# Patient Record
Sex: Female | Born: 1995 | Hispanic: Yes | Marital: Single | Smoking: Current every day smoker
Health system: Southern US, Community
[De-identification: ages and names within clinical notes are randomized; demographics above are authoritative.]

## PROBLEM LIST (undated history)

## (undated) HISTORY — PX: NO PAST SURGERIES: SHX2092

---
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
## 2017-08-27 ENCOUNTER — Encounter: Payer: Self-pay | Admitting: Physician Assistant

## 2017-08-27 ENCOUNTER — Ambulatory Visit (INDEPENDENT_AMBULATORY_CARE_PROVIDER_SITE_OTHER): Payer: Medicaid Other | Admitting: Physician Assistant

## 2017-08-27 VITALS — BP 112/64 | HR 84 | Temp 98.4°F | Resp 16 | Ht 64.0 in | Wt 122.0 lb

## 2017-08-27 DIAGNOSIS — F329 Major depressive disorder, single episode, unspecified: Secondary | ICD-10-CM

## 2017-08-27 DIAGNOSIS — F32A Depression, unspecified: Secondary | ICD-10-CM

## 2017-08-27 DIAGNOSIS — F419 Anxiety disorder, unspecified: Secondary | ICD-10-CM

## 2017-08-27 DIAGNOSIS — Z113 Encounter for screening for infections with a predominantly sexual mode of transmission: Secondary | ICD-10-CM

## 2017-08-27 DIAGNOSIS — M549 Dorsalgia, unspecified: Secondary | ICD-10-CM | POA: Diagnosis not present

## 2017-08-27 DIAGNOSIS — Z114 Encounter for screening for human immunodeficiency virus [HIV]: Secondary | ICD-10-CM

## 2017-08-27 MED ORDER — DULOXETINE HCL 30 MG PO CPEP: 30.0000 mg | ORAL_CAPSULE | Freq: Every day | ORAL | 0 refills | Status: AC

## 2017-08-27 NOTE — Progress Notes (Signed)
Patient: Mary Waller Female    DOB: [birth year]   21 y.o.   MRN: [id number] Visit Date: 08/27/2017  Today's Provider: Trey SailorsAdriana M Pollak, PA-C   Chief Complaint  Patient presents with  . Establish Care  . Back Pain   Subjective:    Mary Waller presents today to establish care. She lives in [place], [place] and is originally from [place], [place]. She is currently living with her boyfriend of a year, feels safe and supported. She is not working, plans on starting business. Has pet cat, Snowflake, and turtle, tater tot.  She is currently smoking 3 cigarettes per day, down from half a pack per day. Occasionally uses Marijuana  Had Nexplanon placed 03/2016. She has 6 siblings.   Parents living, has a closer relationship with her god parents.  She is sexually active with single female partner, her boyfriend. She would like to be screened for STIs today.   She reports several year history of back pain. Pertinent info below. She reports these get worse with anxiety. Also report at some point she was told she may have scoliosis but was never evaluated via Xrays.   She reports a history of anxiety and panic attacks. Reports this increases when she is around her family and decreases when she is not. No SI/HI.   Back Pain  This is a chronic problem. The current episode started more than 1 year ago. The problem is unchanged. The pain is present in the lumbar spine (Pt reports her back pain is "all over" but lower back is the worse.). The quality of the pain is described as aching and stabbing. The pain radiates to the right thigh and left thigh. The symptoms are aggravated by position and sitting. Pertinent negatives include no abdominal pain, bladder incontinence, bowel incontinence, chest pain, dysuria, fever, headaches, leg pain, numbness, paresis, paresthesias, pelvic pain, perianal numbness, tingling, weakness or weight loss. She has tried NSAIDs for the symptoms. The treatment provided mild  relief.       No Known Allergies   Current Outpatient Medications:  .  etonogestrel (NEXPLANON) 68 MG IMPL implant, 1 each by Subdermal route once., Disp: , Rfl:   Review of Systems  Constitutional: Negative.  Negative for fever and weight loss.  HENT: Negative.   Eyes: Negative.   Respiratory: Negative.   Cardiovascular: Negative for chest pain.  Gastrointestinal: Negative for abdominal pain and bowel incontinence.  Endocrine: Negative.   Genitourinary: Negative for bladder incontinence, dysuria and pelvic pain.  Musculoskeletal: Positive for back pain.  Neurological: Negative for dizziness, tingling, tremors, seizures, syncope, facial asymmetry, speech difficulty, weakness, light-headedness, numbness, headaches and paresthesias.    Social History   Tobacco Use  . Smoking status: Current Every Day Smoker    Packs/day: 0.25    Years: 1.00    Pack years: 0.25    Types: Cigarettes  . Smokeless tobacco: Never Used  Substance Use Topics  . Alcohol use: No    Comment: Very Rarely   Objective:   BP 112/64 (BP Location: Right Arm, Patient Position: Sitting, Cuff Size: Normal)   Pulse 84   Temp 98.4 F (36.9 C) (Oral)   Resp 16   Ht 5\' 4"  (1.626 m)   Wt 122 lb (55.3 kg)   LMP 08/18/2017   BMI 20.94 kg/m  Vitals:   08/27/17 1027  BP: 112/64  Pulse: 84  Resp: 16  Temp: 98.4 F (36.9 C)  TempSrc: Oral  Weight: 122 lb (  55.3 kg)  Height: 5\' 4"  (1.626 m)     Physical Exam  Constitutional: She is oriented to person, place, and time. She appears well-developed and well-nourished.  HENT:  Right Ear: External ear normal.  Left Ear: External ear normal.  Eyes: Conjunctivae are normal.  Neck: Neck supple.  Cardiovascular: Normal rate and regular rhythm.  Pulmonary/Chest: Effort normal and breath sounds normal.  Musculoskeletal: Normal range of motion. She exhibits no edema, tenderness or deformity.  Right shoulder blade slightly higher than left on Adams forward  bend test.   Lymphadenopathy:    She has no cervical adenopathy.  Neurological: She is alert and oriented to person, place, and time.  Skin: Skin is warm and dry.  Psychiatric: She has a normal mood and affect. Her behavior is normal.        Assessment & Plan:     1. Anxiety and depression  - DULoxetine (CYMBALTA) 30 MG capsule; Take 1 capsule (30 mg total) by mouth daily.  Dispense: 90 capsule; Refill: 0  2. Back pain, unspecified back location, unspecified back pain laterality, unspecified chronicity  Possible scoliosis, offered XRAY but counseled that unless curvature is very severe which it does not appear to be in clinic, that treatment will largely be symptomatic with physical therapy and pain relief. Will start Cymbalta for anxiety which may also help back pain.  - DULoxetine (CYMBALTA) 30 MG capsule; Take 1 capsule (30 mg total) by mouth daily.  Dispense: 90 capsule; Refill: 0  3. Screening examination for STD (sexually transmitted disease)  - DULoxetine (CYMBALTA) 30 MG capsule; Take 1 capsule (30 mg total) by mouth daily.  Dispense: 90 capsule; Refill: 0 - Chlamydia/Gonococcus/Trichomonas, NAA  4. Encounter for screening for HIV  - HIV antibody (with reflex)  Return in about 1 month (around 09/24/2017) for cymbalta, anxiety, back pain.  The entirety of the information documented in the History of Present Illness, Review of Systems and Physical Exam were personally obtained by me. Portions of this information were initially documented by Kavin Leech, CMA and reviewed by me for thoroughness and accuracy.         Trey Sailors, PA-C  [facility]

## 2017-08-28 ENCOUNTER — Other Ambulatory Visit: Payer: Self-pay | Admitting: Physician Assistant

## 2017-08-28 DIAGNOSIS — F32A Depression, unspecified: Secondary | ICD-10-CM | POA: Insufficient documentation

## 2017-08-28 DIAGNOSIS — M549 Dorsalgia, unspecified: Secondary | ICD-10-CM | POA: Insufficient documentation

## 2017-08-28 DIAGNOSIS — F419 Anxiety disorder, unspecified: Secondary | ICD-10-CM | POA: Insufficient documentation

## 2017-08-28 DIAGNOSIS — F329 Major depressive disorder, single episode, unspecified: Secondary | ICD-10-CM | POA: Insufficient documentation

## 2017-08-28 LAB — HIV ANTIBODY (ROUTINE TESTING W REFLEX): HIV Screen 4th Generation wRfx: NONREACTIVE

## 2017-08-29 ENCOUNTER — Telehealth: Payer: Self-pay

## 2017-08-29 ENCOUNTER — Other Ambulatory Visit: Payer: Self-pay | Admitting: Physician Assistant

## 2017-08-29 DIAGNOSIS — A599 Trichomoniasis, unspecified: Secondary | ICD-10-CM

## 2017-08-29 LAB — CHLAMYDIA/GONOCOCCUS/TRICHOMONAS, NAA
Chlamydia by NAA: NEGATIVE
Gonococcus by NAA: NEGATIVE
Trich vag by NAA: POSITIVE — AB

## 2017-08-29 MED ORDER — METRONIDAZOLE 500 MG PO TABS: 2000.0000 mg | ORAL_TABLET | Freq: Once | ORAL | 0 refills | Status: AC

## 2017-08-29 NOTE — Telephone Encounter (Signed)
[id number] 

## 2017-08-29 NOTE — Telephone Encounter (Signed)
-----   Message from Trey SailorsAdriana M Pollak, [place]-C sent at 08/29/2017  8:38 AM EST ----- Gonorrhea and chlamydia are negative. Trichomonas is positive. The treatment for this is 2g flagyl in a single dose. Do not use alcohol with this medication. Do not have sex for a week after treatment. Her partner should be tested. I will send this medication in to her pharmacy. She should return to clinic in three months for retest as rate of reinfection is high.

## 2017-08-29 NOTE — Progress Notes (Signed)
[facility]yl [facility] to [facility] [place].

## 2017-08-29 NOTE — Telephone Encounter (Signed)
Patient was advised. KW 

## 2017-09-02 ENCOUNTER — Telehealth: Payer: Self-pay | Admitting: Physician Assistant

## 2017-09-02 DIAGNOSIS — R112 Nausea with vomiting, unspecified: Secondary | ICD-10-CM

## 2017-09-02 NOTE — Telephone Encounter (Signed)
Pt stated that she has been taking DULoxetine (CYMBALTA) 30 MG capsule about 20 minutes before she eats and the medication makes her nauseous and she vomits. Pt is requesting a call back to see if there is a lower dose she could try or what she should do. [facility]. Please advise. Thanks TNP

## 2017-09-03 NOTE — Telephone Encounter (Signed)
Pt advised.  She agreed to try a short course of nausea medication.  Please send to [facility] [place].   Thanks,   -Vernona RiegerLaura

## 2017-09-03 NOTE — Telephone Encounter (Signed)
This is a side effect of the medication. It does get better the longer you take it. I would advise taking it with a little bit of toast or crackers, taking it at night. If she likes, I can send her in a short course of nausea medication to get her through the initial couple weeks where this side effect is the worst.

## 2017-09-04 MED ORDER — ONDANSETRON HCL 4 MG PO TABS: 4.0000 mg | ORAL_TABLET | Freq: Three times a day (TID) | ORAL | 0 refills | Status: DC | PRN

## 2017-09-04 NOTE — Addendum Note (Signed)
Addended by: Trey SailorsPOLLAK, ADRIANA M on: 09/04/2017 08:50 AM   Modules accepted: Orders

## 2017-09-04 NOTE — Telephone Encounter (Signed)
[facility]fran sent to [facility].

## 2017-09-19 NOTE — Telephone Encounter (Signed)
Encounter opened in error

## 2017-09-25 ENCOUNTER — Ambulatory Visit: Payer: Medicaid Other | Admitting: Physician Assistant

## 2018-12-28 ENCOUNTER — Emergency Department
Admission: EM | Admit: 2018-12-28 | Discharge: 2018-12-28 | Disposition: A | Discharge status: Home / Self Care | Payer: Self-pay | Attending: Emergency Medicine | Admitting: Emergency Medicine

## 2018-12-28 ENCOUNTER — Other Ambulatory Visit: Payer: Self-pay

## 2018-12-28 ENCOUNTER — Encounter: Payer: Self-pay | Admitting: *Deleted

## 2018-12-28 ENCOUNTER — Emergency Department: Payer: Self-pay

## 2018-12-28 DIAGNOSIS — S99922A Unspecified injury of left foot, initial encounter: Secondary | ICD-10-CM | POA: Insufficient documentation

## 2018-12-28 DIAGNOSIS — Y929 Unspecified place or not applicable: Secondary | ICD-10-CM | POA: Insufficient documentation

## 2018-12-28 DIAGNOSIS — Y9389 Activity, other specified: Secondary | ICD-10-CM | POA: Insufficient documentation

## 2018-12-28 DIAGNOSIS — Z79899 Other long term (current) drug therapy: Secondary | ICD-10-CM | POA: Insufficient documentation

## 2018-12-28 DIAGNOSIS — Y998 Other external cause status: Secondary | ICD-10-CM | POA: Insufficient documentation

## 2018-12-28 DIAGNOSIS — W228XXA Striking against or struck by other objects, initial encounter: Secondary | ICD-10-CM | POA: Insufficient documentation

## 2018-12-28 DIAGNOSIS — F17210 Nicotine dependence, cigarettes, uncomplicated: Secondary | ICD-10-CM | POA: Insufficient documentation

## 2018-12-28 MED ORDER — IBUPROFEN 600 MG PO TABS: 600.0000 mg | ORAL_TABLET | Freq: Four times a day (QID) | ORAL | 0 refills | Status: AC | PRN

## 2018-12-28 MED ORDER — MELOXICAM 7.5 MG PO TABS
15.0000 mg | ORAL_TABLET | Freq: Once | ORAL | Status: AC
  Administered 2018-12-28: 15 mg via ORAL
  Filled 2018-12-28: qty 2

## 2018-12-28 NOTE — ED Provider Notes (Signed)
[facility] Emergency Department Provider Note  ____________________________________________  Time seen: Approximately 8:36 PM  I have reviewed the triage vital signs and the nursing notes.   HISTORY  Chief Complaint Ankle Pain    HPI Mary Waller is a 23 y.o. female that presents to the emergency department for evaluation of foot pain after injury today.  Patient states that she was looking at [employer] with her fianc when her flip-flop got caught in a step and her toes went upwards.  She has been icing foot with out relief.  No other injury.  No past medical history on file.  Patient Active Problem List   Diagnosis Date Noted  . Anxiety and depression 08/28/2017  . Back pain 08/28/2017    Past Surgical History:  Procedure Laterality Date  . NO PAST SURGERIES      Prior to Admission medications   Medication Sig Start Date End Date Taking? Authorizing Provider  DULoxetine (CYMBALTA) 30 MG capsule Take 1 capsule (30 mg total) by mouth daily. 08/27/17 11/25/17  Trey SailorsPollak, Adriana M, PA-C  etonogestrel (NEXPLANON) 68 MG IMPL implant 1 each by Subdermal route once.    [provider]  ibuprofen (ADVIL) 600 MG tablet Take 1 tablet (600 mg total) by mouth every 6 (six) hours as needed. 12/28/18   Enid DerryWagner, Corrina Steffensen, PA-C  ondansetron (ZOFRAN) 4 MG tablet Take 1 tablet (4 mg total) by mouth every 8 (eight) hours as needed for nausea or vomiting. 09/04/17   Trey SailorsPollak, Adriana M, PA-C    Allergies Patient has no known allergies.  Family History  Problem Relation Age of Onset  . Depression Mother   . Anxiety disorder Mother   . Sleep apnea Mother   . Arthritis Mother   . Healthy Father   . Alcohol abuse Father   . Breast cancer Maternal Grandmother        Breast Cancer in her 2730's  . Leukemia Maternal Grandfather   . Diabetes Paternal Grandmother   . Diabetes Paternal Grandfather   . Cancer Paternal Grandfather     Social History Social History    Tobacco Use  . Smoking status: Current Every Day Smoker    Packs/day: 0.25    Years: 1.00    Pack years: 0.25    Types: Cigarettes  . Smokeless tobacco: Never Used  Substance Use Topics  . Alcohol use: No    Comment: Very Rarely  . Drug use: Yes    Types: Marijuana     Review of Systems  Gastrointestinal: No abdominal pain. Musculoskeletal: Positive for foot pain.  Skin: Negative for rash, abrasions, lacerations. Positive for ecchymosis. Neurological: Negative for numbness or tingling   ____________________________________________   PHYSICAL EXAM:  VITAL SIGNS: ED Triage Vitals  Enc Vitals Group     BP 12/28/18 1957 104/71     Pulse Rate 12/28/18 1957 (!) 124     Resp 12/28/18 1957 20     Temp 12/28/18 1957 98.7 F (37.1 C)     Temp Source 12/28/18 1957 Oral     SpO2 12/28/18 1957 97 %     Weight --      Height --      Head Circumference --      Peak Flow --      Pain Score 12/28/18 1956 7     Pain Loc --      Pain Edu? --      Excl. in GC? --      Constitutional: Alert  and oriented. Well appearing and in no acute distress. Eyes: Conjunctivae are normal. PERRL. EOMI. Head: Atraumatic. ENT:      Ears:      Nose: No congestion/rhinnorhea.      Mouth/Throat: Mucous membranes are moist.  Neck: No stridor.   Cardiovascular: Normal rate, regular rhythm.  Good peripheral circulation. Respiratory: Normal respiratory effort without tachypnea or retractions. Lungs CTAB. Good air entry to the bases with no decreased or absent breath sounds. Musculoskeletal: Full range of motion to all extremities. No gross deformities appreciated.  Tenderness to palpation to fourth and fifth left toes with some mild overlying ecchymosis.  Palpable dorsalis pulses bilaterally. Neurologic:  Normal speech and language. No gross focal neurologic deficits are appreciated.  Skin:  Skin is warm, dry and intact. No rash noted. Psychiatric: Mood and affect are normal. Speech and behavior  are normal. Patient exhibits appropriate insight and judgement.   ____________________________________________   LABS (all labs ordered are listed, but only abnormal results are displayed)  Labs Reviewed - No data to display ____________________________________________  EKG   ____________________________________________  RADIOLOGY Robinette Haines, personally viewed and evaluated these images (plain radiographs) as part of my medical decision making, as well as reviewing the written report by the radiologist.  Dg Foot Complete Left  Result Date: 12/28/2018 CLINICAL DATA:  Left foot pain after trip and fall today. EXAM: LEFT FOOT - COMPLETE 3+ VIEW COMPARISON:  None. FINDINGS: There is no evidence of fracture or dislocation. There is no evidence of arthropathy or other focal bone abnormality. Soft tissues are unremarkable. IMPRESSION: Negative radiographs of the left foot. Electronically Signed   By: Keith Rake M.D.   On: 12/28/2018 21:19    ____________________________________________    PROCEDURES  Procedure(s) performed:    Procedures    Medications  meloxicam (MOBIC) tablet 15 mg (15 mg Oral Given 12/28/18 2156)     ____________________________________________   INITIAL IMPRESSION / ASSESSMENT AND PLAN / ED COURSE  Pertinent labs & imaging results that were available during my care of the patient were reviewed by me and considered in my medical decision making (see chart for details).  Review of the [place] CSRS was performed in accordance of the [place] prior to dispensing any controlled drugs.   Patient presented to the emergency department for evaluation of foot injury.  Vital signs and exam are reassuring.  X-ray negative for acute bony abnormalities.  Ace wrap was placed.  Postop shoe was given.  Patient declines crutches.  Mobic was given for pain inflammation.  Patient will be discharged home with prescriptions for Motrin. Patient is to follow up with  primary care as directed. Patient is given ED precautions to return to the ED for any worsening or new symptoms.     ____________________________________________  FINAL CLINICAL IMPRESSION(S) / ED DIAGNOSES  Final diagnoses:  Injury of left foot, initial encounter      NEW MEDICATIONS STARTED DURING THIS VISIT:  ED Discharge Orders         Ordered    ibuprofen (ADVIL) 600 MG tablet  Every 6 hours PRN     12/28/18 2157              This chart was dictated using voice recognition software/Dragon. Despite best efforts to proofread, errors can occur which can change the meaning. Any change was purely unintentional.    Laban Emperor, PA-C 12/28/18 2248    Carrie Mew, MD 12/28/18 [phone]

## 2018-12-28 NOTE — ED Triage Notes (Signed)
Pt has left ankle and foot pain.  Pt tripped and fell today.  Pt alert

## 2019-03-02 ENCOUNTER — Emergency Department (HOSPITAL_COMMUNITY)
Admission: EM | Admit: 2019-03-02 | Discharge: 2019-03-02 | Disposition: A | Discharge status: Home / Self Care | Payer: Self-pay | Attending: Emergency Medicine | Admitting: Emergency Medicine

## 2019-03-02 ENCOUNTER — Encounter (HOSPITAL_COMMUNITY): Payer: Self-pay | Admitting: Emergency Medicine

## 2019-03-02 ENCOUNTER — Emergency Department (HOSPITAL_COMMUNITY): Payer: Self-pay

## 2019-03-02 DIAGNOSIS — S8011XA Contusion of right lower leg, initial encounter: Secondary | ICD-10-CM

## 2019-03-02 DIAGNOSIS — Y929 Unspecified place or not applicable: Secondary | ICD-10-CM | POA: Insufficient documentation

## 2019-03-02 DIAGNOSIS — R739 Hyperglycemia, unspecified: Secondary | ICD-10-CM

## 2019-03-02 DIAGNOSIS — T50901A Poisoning by unspecified drugs, medicaments and biological substances, accidental (unintentional), initial encounter: Secondary | ICD-10-CM

## 2019-03-02 DIAGNOSIS — D7589 Other specified diseases of blood and blood-forming organs: Secondary | ICD-10-CM

## 2019-03-02 DIAGNOSIS — T404X1A Poisoning by other synthetic narcotics, accidental (unintentional), initial encounter: Secondary | ICD-10-CM | POA: Insufficient documentation

## 2019-03-02 DIAGNOSIS — F17210 Nicotine dependence, cigarettes, uncomplicated: Secondary | ICD-10-CM | POA: Insufficient documentation

## 2019-03-02 DIAGNOSIS — W010XXA Fall on same level from slipping, tripping and stumbling without subsequent striking against object, initial encounter: Secondary | ICD-10-CM | POA: Insufficient documentation

## 2019-03-02 DIAGNOSIS — R7309 Other abnormal glucose: Secondary | ICD-10-CM | POA: Insufficient documentation

## 2019-03-02 DIAGNOSIS — Y999 Unspecified external cause status: Secondary | ICD-10-CM | POA: Insufficient documentation

## 2019-03-02 DIAGNOSIS — W260XXA Contact with knife, initial encounter: Secondary | ICD-10-CM | POA: Insufficient documentation

## 2019-03-02 DIAGNOSIS — S61211A Laceration without foreign body of left index finger without damage to nail, initial encounter: Secondary | ICD-10-CM

## 2019-03-02 DIAGNOSIS — Y939 Activity, unspecified: Secondary | ICD-10-CM | POA: Insufficient documentation

## 2019-03-02 DIAGNOSIS — Z79899 Other long term (current) drug therapy: Secondary | ICD-10-CM | POA: Insufficient documentation

## 2019-03-02 LAB — COMPREHENSIVE METABOLIC PANEL
ALT: 18 U/L (ref 0–44)
AST: 20 U/L (ref 15–41)
Albumin: 4.2 g/dL (ref 3.5–5.0)
Alkaline Phosphatase: 52 U/L (ref 38–126)
Anion gap: 8 (ref 5–15)
BUN: 16 mg/dL (ref 6–20)
CO2: 24 mmol/L (ref 22–32)
Calcium: 9.1 mg/dL (ref 8.9–10.3)
Chloride: 105 mmol/L (ref 98–111)
Creatinine, Ser: 0.76 mg/dL (ref 0.44–1.00)
GFR calc Af Amer: >60 mL/min (ref >60)
GFR calc non Af Amer: >60 mL/min (ref >60)
Glucose, Bld: 169 mg/dL — ABNORMAL HIGH (ref 70–99)
Potassium: 3.9 mmol/L (ref 3.5–5.1)
Sodium: 137 mmol/L (ref 135–145)
Total Bilirubin: 0.4 mg/dL (ref 0.3–1.2)
Total Protein: 7.2 g/dL (ref 6.5–8.1)

## 2019-03-02 LAB — CBC WITH DIFFERENTIAL/PLATELET
Abs Immature Granulocytes: 0.15 10*3/uL — ABNORMAL HIGH (ref 0.00–0.07)
Basophils Absolute: 0.1 10*3/uL (ref 0.0–0.1)
Basophils Relative: 0 %
Eosinophils Absolute: 0 10*3/uL (ref 0.0–0.5)
Eosinophils Relative: 0 %
HCT: 38.7 % (ref 36.0–46.0)
Hemoglobin: 12.5 g/dL (ref 12.0–15.0)
Immature Granulocytes: 1 %
Lymphocytes Relative: 13 %
Lymphs Abs: 2.2 10*3/uL (ref 0.7–4.0)
MCH: 32.4 pg (ref 26.0–34.0)
MCHC: 32.3 g/dL (ref 30.0–36.0)
MCV: 100.3 fL — ABNORMAL HIGH (ref 80.0–100.0)
Monocytes Absolute: 1 10*3/uL (ref 0.1–1.0)
Monocytes Relative: 6 %
Neutro Abs: 13.5 10*3/uL — ABNORMAL HIGH (ref 1.7–7.7)
Neutrophils Relative %: 80 %
Platelets: 231 10*3/uL (ref 150–400)
RBC: 3.86 MIL/uL — ABNORMAL LOW (ref 3.87–5.11)
RDW: 12.8 % (ref 11.5–15.5)
WBC: 16.9 10*3/uL — ABNORMAL HIGH (ref 4.0–10.5)
nRBC: 0 % (ref 0.0–0.2)

## 2019-03-02 LAB — ETHANOL: Alcohol, Ethyl (B): <10 mg/dL (ref <10)

## 2019-03-02 LAB — SALICYLATE LEVEL: Salicylate Lvl: <7 mg/dL (ref 2.8–30.0)

## 2019-03-02 LAB — I-STAT BETA HCG BLOOD, ED (MC, WL, AP ONLY): I-stat hCG, quantitative: <5 m[IU]/mL (ref <5)

## 2019-03-02 LAB — ACETAMINOPHEN LEVEL: Acetaminophen (Tylenol), Serum: <10 ug/mL — ABNORMAL LOW (ref 10–30)

## 2019-03-02 MED ORDER — LIDOCAINE HCL (PF) 1 % IJ SOLN
5.0000 mL | Freq: Once | INTRAMUSCULAR | Status: AC
  Administered 2019-03-02: 5 mL
  Filled 2019-03-02: qty 30

## 2019-03-02 NOTE — ED Notes (Signed)
XR at bedside

## 2019-03-02 NOTE — ED Triage Notes (Signed)
Patient here from home brought in by family with complaints of ingestion of approx 10-15 Tramadol 2 hours ago. Lethargic. Denies SI. Laceration noted to left hand. States that she was running with knife and fell cutting finger.

## 2019-03-02 NOTE — ED Notes (Signed)
Poison Control recommends:  -[id number]  -supportive care -do not narcan unless unresponsive due to seizures -6-8 hour observation

## 2019-03-02 NOTE — ED Provider Notes (Signed)
[facility]-EMERGENCY DEPT Provider Note   CSN: [id number] Arrival date & time: 03/02/19  7564    History   Chief Complaint Chief Complaint  Patient presents with  . Ingestion    HPI Mary Waller is a 23 y.o. female.   The history is provided by the patient.  Ingestion  She has history of anxiety and depression and is brought to the ED with possible overdose.  She had fallen earlier in the evening and injured her right lower leg.  She is complaining of pain in the lateral aspect of her right calf.  She had also accidentally cut her left second finger with a knife.  She took ibuprofen and then naproxen but was continued to have pain in her finger.  She took some tramadol and states that she took between 10 and 15 between 11 PM and 2AM.  Her boyfriend was worried that she may have taken too much and brought her in.  She denies suicidal intent.  She denies any other drug use.  She denies ethanol ingestion.  History reviewed. No pertinent past medical history.  Patient Active Problem List   Diagnosis Date Noted  . Anxiety and depression 08/28/2017  . Back pain 08/28/2017    Past Surgical History:  Procedure Laterality Date  . NO PAST SURGERIES       OB History   No obstetric history on file.      Home Medications    Prior to Admission medications   Medication Sig Start Date End Date Taking? Authorizing Provider  DULoxetine (CYMBALTA) 30 MG capsule Take 1 capsule (30 mg total) by mouth daily. 08/27/17 11/25/17  Trey Sailors, PA-C  etonogestrel (NEXPLANON) 68 MG IMPL implant 1 each by Subdermal route once.    [provider]  ibuprofen (ADVIL) 600 MG tablet Take 1 tablet (600 mg total) by mouth every 6 (six) hours as needed. 12/28/18   Enid Derry, PA-C  ondansetron (ZOFRAN) 4 MG tablet Take 1 tablet (4 mg total) by mouth every 8 (eight) hours as needed for nausea or vomiting. 09/04/17   Trey Sailors, PA-C    Family History  Family History  Problem Relation Age of Onset  . Depression Mother   . Anxiety disorder Mother   . Sleep apnea Mother   . Arthritis Mother   . Healthy Father   . Alcohol abuse Father   . Breast cancer Maternal Grandmother        Breast Cancer in her 71's  . Leukemia Maternal Grandfather   . Diabetes Paternal Grandmother   . Diabetes Paternal Grandfather   . Cancer Paternal Grandfather     Social History Social History   Tobacco Use  . Smoking status: Current Every Day Smoker    Packs/day: 0.25    Years: 1.00    Pack years: 0.25    Types: Cigarettes  . Smokeless tobacco: Never Used  Substance Use Topics  . Alcohol use: No    Comment: Very Rarely  . Drug use: Yes    Types: Marijuana     Allergies   Patient has no known allergies.   Review of Systems Review of Systems  All other systems reviewed and are negative.    Physical Exam Updated Vital Signs BP 108/71 (BP Location: Right Arm)   Pulse (!) 46   Temp 97.6 F (36.4 C) (Oral)   Resp 12   SpO2 98%   Physical Exam Vitals signs and nursing note reviewed.  23 year old female, drowsy but arousable, and in no acute distress. Vital signs are significant for slow heart rate. Oxygen saturation is 98%, which is normal. Head is normocephalic and atraumatic.  Pupils are 3 mm, EOMI. Oropharynx is clear. Neck is nontender and supple without adenopathy or JVD. Back is nontender and there is no CVA tenderness. Lungs are clear without rales, wheezes, or rhonchi. Chest is nontender. Heart has regular rate and rhythm without murmur. Abdomen is soft, flat, nontender without masses or hepatosplenomegaly and peristalsis is normoactive. Extremities have no cyanosis or edema, full range of motion is present.  Laceration is present at the PIP joint of the left second finger.  Mild tenderness is noted in the right mid calf laterally. Skin is warm and dry without rash. Neurologic: Sleepy but arousable, speech slightly  slurred, oriented x3, cranial nerves are intact, there are no motor or sensory deficits.  ED Treatments / Results  Labs (all labs ordered are listed, but only abnormal results are displayed) Labs Reviewed  CBC WITH DIFFERENTIAL/PLATELET - Abnormal; Notable for the following components:      Result Value   WBC 16.9 (*)    RBC 3.86 (*)    MCV 100.3 (*)    Neutro Abs 13.5 (*)    Abs Immature Granulocytes 0.15 (*)    All other components within normal limits  COMPREHENSIVE METABOLIC PANEL  ETHANOL  ACETAMINOPHEN LEVEL  SALICYLATE LEVEL  RAPID URINE DRUG SCREEN, HOSP PERFORMED  I-STAT BETA HCG BLOOD, ED ([facility], [facility], [facility] ONLY)    EKG EKG Interpretation  Date/Time:  Tuesday March 02 2019 05:34:37 EDT Ventricular Rate:  52 PR Interval:    QRS Duration: 98 QT Interval:  467 QTC Calculation: 435 R Axis:   88 Text Interpretation:  Sinus rhythm Probable left atrial enlargement RSR' in V1 or V2, probably normal variant Nonspecific T abnrm, anterolateral leads ST elev, probable normal early repol pattern No old tracing to compare Confirmed by Delora Fuel ([id number]) on 03/02/2019 5:45:07 AM   Radiology No results found.  Procedures .Marland KitchenLaceration Repair  Date/Time: 03/02/2019 6:55 AM Performed by: Delora Fuel, MD Authorized by: Delora Fuel, MD   Consent:    Consent obtained:  Verbal   Consent given by:  Patient   Risks discussed:  Infection and pain   Alternatives discussed:  No treatment Anesthesia (see MAR for exact dosages):    Anesthesia method:  Local infiltration   Local anesthetic:  Lidocaine 1% w/o epi Laceration details:    Location:  Finger   Finger location:  L index finger   Length (cm):  3   Depth (mm):  3 Repair type:    Repair type:  Simple Pre-procedure details:    Preparation:  Patient was prepped and draped in usual sterile fashion Exploration:    Hemostasis achieved with:  Direct pressure   Wound exploration: entire depth of wound probed and visualized      Wound extent: no foreign bodies/material noted, no nerve damage noted, no tendon damage noted and no vascular damage noted     Contaminated: no   Treatment:    Area cleansed with:  Saline   Amount of cleaning:  Standard Skin repair:    Repair method:  Sutures   Suture size:  5-0   Suture material:  Prolene   Suture technique:  Simple interrupted   Number of sutures:  6 Approximation:    Approximation:  Close Post-procedure details:    Dressing:  Antibiotic ointment  Patient tolerance of procedure:  Tolerated well, no immediate complications    CRITICAL CARE Performed by: Dione Boozeavid Nakeshia Waldeck Total critical care time: 35 minutes Critical care time was exclusive of separately billable procedures and treating other patients. Critical care was necessary to treat or prevent imminent or life-threatening deterioration. Critical care was time spent personally by me on the following activities: development of treatment plan with patient and/or surrogate as well as nursing, discussions with consultants, evaluation of patient's response to treatment, examination of patient, obtaining history from patient or surrogate, ordering and performing treatments and interventions, ordering and review of laboratory studies, ordering and review of radiographic studies, pulse oximetry and re-evaluation of patient's condition.  Medications Ordered in ED Medications  lidocaine (PF) (XYLOCAINE) 1 % injection 5 mL (5 mLs Infiltration Given by Other 03/02/19 [id number])     Initial Impression / Assessment and Plan / ED Course  I have reviewed the triage vital signs and the nursing notes.  Pertinent labs & imaging results that were available during my care of the patient were reviewed by me and considered in my medical decision making (see chart for details).  Apparent narcotic overdose, but maintaining adequate respirations and oxygen saturation.  Laceration of left second finger will require sutures.  Injury to right lower  leg, will check chest x-ray.  Will obtain consultation with poison control, but I would anticipate with tramadol overdose she needs to be observed for seizures and respiratory depression.  ECG is obtained showing normal QT interval.  Old records are reviewed, and she has no relevant past visits.  Poison control recommends observation for 6 hours.  Laceration is closed with sutures.  Labs are significant for mild macrocytosis without anemia.  Ethanol, acetaminophen, salicylate not detectable.  Glucose is mildly elevated 169 and will need to be followed as an, suspect stress-induced hyperglycemia.  Case is signed out to Dr. Madilyn Hookees.  Final Clinical Impressions(s) / ED Diagnoses   Final diagnoses:  Accidental drug overdose, initial encounter  Laceration of left index finger, initial encounter  Contusion of right calf, initial encounter  Macrocytosis without anemia  Elevated random blood glucose level    ED Discharge Orders    None       Dione BoozeGlick, Jodine Muchmore, MD 03/02/19 0700

## 2019-03-02 NOTE — ED Provider Notes (Signed)
Patient care assumed at 0700.  Pt here for evaluation following accidental tramadol overdose.  Plan to obs until 1030.  On reassessment patient is requesting discharge home. She is awake and alert and in no acute distress. She denies any active suicidal ideation. Discussed with patient recommendation from poison control for observation until 1030. Discussed with patient's husband recommendations as well. Discussed the risk of being discharged early of respiratory depression, seizure. They understand this risk and wish to be discharged. Patient was discharged against medical advice. Discussed with her home care for accidental overdose and also laceration. Return precautions discussed.   Quintella Reichert, MD 03/02/19 [phone]

## 2019-03-02 NOTE — ED Notes (Signed)
Suture cart at bedside 

## 2019-06-13 ENCOUNTER — Emergency Department (HOSPITAL_COMMUNITY)
Admission: EM | Admit: 2019-06-13 | Discharge: 2019-06-13 | Disposition: A | Discharge status: Home / Self Care | Payer: Self-pay | Attending: Emergency Medicine | Admitting: Emergency Medicine

## 2019-06-13 ENCOUNTER — Encounter (HOSPITAL_COMMUNITY): Payer: Self-pay

## 2019-06-13 ENCOUNTER — Emergency Department (HOSPITAL_COMMUNITY): Payer: Self-pay

## 2019-06-13 ENCOUNTER — Other Ambulatory Visit: Payer: Self-pay

## 2019-06-13 DIAGNOSIS — R102 Pelvic and perineal pain: Secondary | ICD-10-CM | POA: Insufficient documentation

## 2019-06-13 DIAGNOSIS — F17210 Nicotine dependence, cigarettes, uncomplicated: Secondary | ICD-10-CM | POA: Insufficient documentation

## 2019-06-13 DIAGNOSIS — R1031 Right lower quadrant pain: Secondary | ICD-10-CM

## 2019-06-13 DIAGNOSIS — N130 Hydronephrosis with ureteropelvic junction obstruction: Secondary | ICD-10-CM | POA: Insufficient documentation

## 2019-06-13 DIAGNOSIS — N201 Calculus of ureter: Secondary | ICD-10-CM

## 2019-06-13 LAB — I-STAT BETA HCG BLOOD, ED (MC, WL, AP ONLY): I-stat hCG, quantitative: <5 m[IU]/mL (ref <5)

## 2019-06-13 LAB — COMPREHENSIVE METABOLIC PANEL
ALT: 13 U/L (ref 0–44)
AST: 19 U/L (ref 15–41)
Albumin: 3.7 g/dL (ref 3.5–5.0)
Alkaline Phosphatase: 51 U/L (ref 38–126)
Anion gap: 7 (ref 5–15)
BUN: 10 mg/dL (ref 6–20)
CO2: 26 mmol/L (ref 22–32)
Calcium: 9.2 mg/dL (ref 8.9–10.3)
Chloride: 108 mmol/L (ref 98–111)
Creatinine, Ser: 0.77 mg/dL (ref 0.44–1.00)
GFR calc Af Amer: >60 mL/min (ref >60)
GFR calc non Af Amer: >60 mL/min (ref >60)
Glucose, Bld: 95 mg/dL (ref 70–99)
Potassium: 3.6 mmol/L (ref 3.5–5.1)
Sodium: 141 mmol/L (ref 135–145)
Total Bilirubin: 0.3 mg/dL (ref 0.3–1.2)
Total Protein: 6.3 g/dL — ABNORMAL LOW (ref 6.5–8.1)

## 2019-06-13 LAB — CBC
HCT: 38.5 % (ref 36.0–46.0)
Hemoglobin: 12.6 g/dL (ref 12.0–15.0)
MCH: 32.5 pg (ref 26.0–34.0)
MCHC: 32.7 g/dL (ref 30.0–36.0)
MCV: 99.2 fL (ref 80.0–100.0)
Platelets: 305 10*3/uL (ref 150–400)
RBC: 3.88 MIL/uL (ref 3.87–5.11)
RDW: 12.1 % (ref 11.5–15.5)
WBC: 9.8 10*3/uL (ref 4.0–10.5)
nRBC: 0 % (ref 0.0–0.2)

## 2019-06-13 LAB — URINALYSIS, ROUTINE W REFLEX MICROSCOPIC
Bilirubin Urine: NEGATIVE
Glucose, UA: NEGATIVE mg/dL
Ketones, ur: NEGATIVE mg/dL
Leukocytes,Ua: NEGATIVE
Nitrite: NEGATIVE
Protein, ur: NEGATIVE mg/dL
Specific Gravity, Urine: 1.01 (ref 1.005–1.030)
pH: 8 (ref 5.0–8.0)

## 2019-06-13 LAB — WET PREP, GENITAL
Clue Cells Wet Prep HPF POC: NONE SEEN
Sperm: NONE SEEN
Trich, Wet Prep: NONE SEEN
Yeast Wet Prep HPF POC: NONE SEEN

## 2019-06-13 LAB — LIPASE, BLOOD: Lipase: 23 U/L (ref 11–51)

## 2019-06-13 MED ORDER — FENTANYL CITRATE (PF) 100 MCG/2ML IJ SOLN
50.0000 ug | Freq: Once | INTRAMUSCULAR | Status: AC
  Administered 2019-06-13: 50 ug via INTRAVENOUS
  Filled 2019-06-13: qty 2

## 2019-06-13 MED ORDER — SODIUM CHLORIDE 0.9% FLUSH: 3.0000 mL | Freq: Once | INTRAVENOUS | Status: DC

## 2019-06-13 MED ORDER — TAMSULOSIN HCL 0.4 MG PO CAPS: 0.4000 mg | ORAL_CAPSULE | Freq: Every day | ORAL | 0 refills | Status: AC

## 2019-06-13 MED ORDER — OXYCODONE HCL 5 MG PO TABS: 5.0000 mg | ORAL_TABLET | ORAL | 0 refills | Status: AC | PRN

## 2019-06-13 MED ORDER — IOHEXOL 300 MG/ML  SOLN
100.0000 mL | Freq: Once | INTRAMUSCULAR | Status: AC | PRN
  Administered 2019-06-13: 100 mL via INTRAVENOUS

## 2019-06-13 MED ORDER — MORPHINE SULFATE (PF) 4 MG/ML IV SOLN
4.0000 mg | Freq: Once | INTRAVENOUS | Status: AC
  Administered 2019-06-13: 4 mg via INTRAVENOUS
  Filled 2019-06-13: qty 1

## 2019-06-13 NOTE — Discharge Instructions (Addendum)
You were seen in the ER for right lower abdominal pain.  Work-up today revealed a small 2 mm stone at the end of your right urinary tube.  The stone is almost at the bladder.  This is the cause of your pain.  Stay well hydrated.  Your urine should be clear in the toilet.  Take Flomax as prescribed, this is a medicine that helps dilate your ureter and helps the stone pass easier. For pain and inflammation you can use a combination of ibuprofen and acetaminophen.  Take [phone] mg acetaminophen (tylenol) every 6 hours or 600 mg ibuprofen (advil, motrin) every 6 hours.  You can take these separately or combine them every 6 hours for maximum pain control. Do not exceed 4,000 mg acetaminophen or 2,400 mg ibuprofen in a 24 hour period.  Do not take ibuprofen containing products if you have history of kidney disease, ulcers, GI bleeding, severe acid reflux, or take a blood thinner.  Do not take acetaminophen if you have liver disease.   For break through and/or severe pain despite ibuprofen and acetaminophen regimen, take 5 mg oxycodone every 4 hours.  Oxycodone is a narcotic pain medication that has risk of overdose, death, dependence and abuse. Mild and expected side effects include nausea, stomach upset, drowsiness, constipation. Do not consume alcohol, drive or use heavy machinery while taking this medication. Do not leave unattended around children. Flush any remaining pills that you do not use and do not share.  The emergency department has a strict policy regarding prescription of narcotic medications. We prescribe a short course for acute, new pain or injuries. We are unable to refill this medication in the emergency department for chronic pain or repeatedly.  Refill need to be done by specialist or primary care provider or pain clinic.  Contact your primary care provider or specialist for chronic pain management and refill on narcotic medications.

## 2019-06-13 NOTE — ED Notes (Signed)
Patient's visitor came out of the room demanding water for the patient and for her to be seen by a provider, when informed that she could not drink and we were waiting on the provider, he started cussing and left the room, stating he is going to get his truck to drive her to a different hospital.

## 2019-06-13 NOTE — ED Provider Notes (Signed)
[facility] DEPT Provider Note   CSN: [id number] Arrival date & time: 06/13/19  0932     History   Chief Complaint Chief Complaint  Patient presents with  . Abdominal Pain    HPI Mary Waller is a 23 y.o. female presents to ER for evaluation of abdominal pain.  Upon entering the room patient asked if I was the doctor.  Explained I am a physician assistant and will be her medical provider today. States "you're not even a f---- doctor". She continued to use vulgar language.  Stated she had been waiting for "30 minutes" and nobody cared. She requested discharge to go to a different hospital.  Twice I attempted to deescalate the situation.  Explained I was seeing a patient who was critically ill and only two providers in the ER.  Twice I asked her if she was willing to start over our conversation and be respectful.  Her boyfriend went out to [employer] and started cursing at staff and walked out.  Patient told me to "get the f--- out" and requested discharge paperwork.       HPI  History reviewed. No pertinent past medical history.  Patient Active Problem List   Diagnosis Date Noted  . Anxiety and depression 08/28/2017  . Back pain 08/28/2017    Past Surgical History:  Procedure Laterality Date  . NO PAST SURGERIES       OB History   No obstetric history on file.      Home Medications    Prior to Admission medications   Medication Sig Start Date End Date Taking? Authorizing Provider  etonogestrel (NEXPLANON) 68 MG IMPL implant 1 each by Subdermal route once.   Yes [provider]  DULoxetine (CYMBALTA) 30 MG capsule Take 1 capsule (30 mg total) by mouth daily. Patient not taking: Reported on 06/13/2019 08/27/17 11/25/17  Trinna Post, PA-C  ibuprofen (ADVIL) 600 MG tablet Take 1 tablet (600 mg total) by mouth every 6 (six) hours as needed. Patient not taking: Reported on 06/13/2019 12/28/18   Laban Emperor, PA-C  ondansetron  (ZOFRAN) 4 MG tablet Take 1 tablet (4 mg total) by mouth every 8 (eight) hours as needed for nausea or vomiting. Patient not taking: Reported on 06/13/2019 09/04/17   Trinna Post, PA-C  oxyCODONE (OXY IR/ROXICODONE) 5 MG immediate release tablet Take 1 tablet (5 mg total) by mouth every 4 (four) hours as needed for up to 3 days for severe pain. 06/13/19 06/16/19  Kinnie Feil, PA-C  tamsulosin (FLOMAX) 0.4 MG CAPS capsule Take 1 capsule (0.4 mg total) by mouth daily for 15 days. 06/13/19 06/28/19  Kinnie Feil, PA-C    Family History Family History  Problem Relation Age of Onset  . Depression Mother   . Anxiety disorder Mother   . Sleep apnea Mother   . Arthritis Mother   . Healthy Father   . Alcohol abuse Father   . Breast cancer Maternal Grandmother        Breast Cancer in her 34's  . Leukemia Maternal Grandfather   . Diabetes Paternal Grandmother   . Diabetes Paternal Grandfather   . Cancer Paternal Grandfather     Social History Social History   Tobacco Use  . Smoking status: Current Every Day Smoker    Packs/day: 0.25    Years: 1.00    Pack years: 0.25    Types: Cigarettes  . Smokeless tobacco: Never Used  Substance Use Topics  . Alcohol  use: No    Comment: Very Rarely  . Drug use: Yes    Types: Marijuana     Allergies   Patient has no known allergies.   Review of Systems Review of Systems  Unable to perform ROS: Other (hostile behavior, uncooperative)  Gastrointestinal: Positive for abdominal pain.  All other systems reviewed and are negative.    Physical Exam Updated Vital Signs BP 111/68   Pulse 88   Temp 98 F (36.7 C) (Oral)   Resp 16   LMP 05/30/2019 (Approximate)   SpO2 99%   Physical Exam Vitals signs and nursing note reviewed.  Constitutional:      Appearance: She is well-developed.     Comments: Non toxic in NAD  HENT:     Head: Normocephalic and atraumatic.     Nose: Nose normal.  Eyes:     Conjunctiva/sclera:  Conjunctivae normal.  Neck:     Musculoskeletal: Normal range of motion.  Cardiovascular:     Rate and Rhythm: Normal rate and regular rhythm.  Pulmonary:     Effort: Pulmonary effort is normal.     Breath sounds: Normal breath sounds.  Abdominal:     General: Bowel sounds are normal.     Palpations: Abdomen is soft.     Tenderness: There is abdominal tenderness in the right lower quadrant and suprapubic area. Positive signs include McBurney's sign.     Comments: Diffuse RLQ abd and pelvic tenderness. +McBurneys with guarding with palpation diffuse in RLQ, right low pelvic area and suprapubic area.  No rigidity.  No rebound.  Mild R low CVA tenderness. Negative Murphy's Murphy's. Active BS to lower quadrants.  Genitourinary:    Adnexa:        Right: Tenderness and fullness present.        Left: Fullness present.      Comments:  Exam performed with EMT at bedside for assistance. External genitalia without lesions.  No groin lymphadenopathy.  Vaginal mucosa and cervix pink without lesions.  Scant likely physiologic clear/white discharge in vaginal vault noted.  Cervical os visualized, closed without polyps, lesions, friability.  No CMT.  Mild R adnexal tenderness with fullness. Mild L adnexal fullness similar to R but non tender.  Perianal skin normal without lesions. Musculoskeletal: Normal range of motion.  Skin:    General: Skin is warm and dry.     Capillary Refill: Capillary refill takes less than 2 seconds.  Neurological:     Mental Status: She is alert.  Psychiatric:        Behavior: Behavior normal.      ED Treatments / Results  Labs (all labs ordered are listed, but only abnormal results are displayed) Labs Reviewed  WET PREP, GENITAL - Abnormal; Notable for the following components:      Result Value   WBC, Wet Prep HPF POC MODERATE (*)    All other components within normal limits  URINALYSIS, ROUTINE W REFLEX MICROSCOPIC - Abnormal; Notable for the following  components:   APPearance HAZY (*)    Hgb urine dipstick MODERATE (*)    Bacteria, UA RARE (*)    All other components within normal limits  COMPREHENSIVE METABOLIC PANEL - Abnormal; Notable for the following components:   Total Protein 6.3 (*)    All other components within normal limits  CBC  LIPASE, BLOOD  I-STAT BETA HCG BLOOD, ED ([facility], [facility], [facility] ONLY)  GC/CHLAMYDIA PROBE AMP ([place]) NOT AT [facility]    EKG None  Radiology Ct Abdomen Pelvis W Contrast  Result Date: 06/13/2019 CLINICAL DATA:  Hematuria. Right lower quadrant pelvic pain. EXAM: CT ABDOMEN AND PELVIS WITH CONTRAST TECHNIQUE: Multidetector CT imaging of the abdomen and pelvis was performed using the standard protocol following bolus administration of intravenous contrast. CONTRAST:  OMNIPAQUE IOHEXOL 300 MG/ML  SOLN COMPARISON:  06/13/2019 FINDINGS: Lower chest: No acute abnormality. Hepatobiliary: No focal liver abnormality is seen. No gallstones, gallbladder wall thickening, or biliary dilatation. Pancreas: Unremarkable. No pancreatic ductal dilatation or surrounding inflammatory changes. Spleen: Normal in size without focal abnormality. Adrenals/Urinary Tract: Normal appearance of the adrenal glands the kidneys are unremarkable. No mass or hydronephrosis identified. Tiny stone at the right UVJ measures 2 mm, image 43/5. There is mild right hydroureter. Stomach/Bowel: Stomach is within normal limits. Appendix appears normal. No evidence of bowel wall thickening, distention, or inflammatory changes. Vascular/Lymphatic: No significant vascular findings are present. No enlarged abdominal or pelvic lymph nodes. Reproductive: Uterus and bilateral adnexa are unremarkable. Other: No abdominal wall hernia or abnormality. No abdominopelvic ascites. Musculoskeletal: Scoliosis IMPRESSION: Right UVJ calculus measures 2 mm. There is mild right hydroureter. Electronically Signed   By: Signa Kell M.D.   On: 06/13/2019 12:36   US Pelvic  Doppler (torsion R/o Or Mass Arterial Flow)  Result Date: 06/13/2019 CLINICAL DATA:  Acute onset of right pelvic pain 3 hours ago. Clinical suspicion for ovarian torsion. EXAM: TRANSABDOMINAL AND TRANSVAGINAL ULTRASOUND OF PELVIS DOPPLER ULTRASOUND OF OVARIES TECHNIQUE: Both transabdominal and transvaginal ultrasound examinations of the pelvis were performed. Transabdominal technique was performed for global imaging of the pelvis including uterus, ovaries, adnexal regions, and pelvic cul-de-sac. It was necessary to proceed with endovaginal exam following the transabdominal exam to visualize the endometrium and ovaries. Color and duplex Doppler ultrasound was utilized to evaluate blood flow to the ovaries. COMPARISON:  None. FINDINGS: Uterus Measurements: 6.8 x 3.2 x 3.9 cm = volume: 44 mL. No fibroids or other mass visualized. Endometrium Thickness: 2 mm.  No focal abnormality visualized. Right ovary Measurements: 2.9 x 2.3 x 2.7 cm = volume: 9.1 mL. Normal appearance/no adnexal mass. Left ovary Measurements: Not visualized, however no adnexal mass identified. Pulsed Doppler evaluation of the right ovary demonstrates normal low-resistance arterial and venous waveforms. The left ovary was not visualized could not be evaluated by duplex Doppler. Other findings No abnormal free fluid. IMPRESSION: Normal appearance uterus and right ovary. No sonographic evidence for right ovarian torsion or other acute findings. Nonvisualization of left ovary, however no left adnexal mass identified. Electronically Signed   By: Danae Orleans M.D.   On: 06/13/2019 10:55   US Pelvic Complete With Transvaginal  Result Date: 06/13/2019 CLINICAL DATA:  Acute onset of right pelvic pain 3 hours ago. Clinical suspicion for ovarian torsion. EXAM: TRANSABDOMINAL AND TRANSVAGINAL ULTRASOUND OF PELVIS DOPPLER ULTRASOUND OF OVARIES TECHNIQUE: Both transabdominal and transvaginal ultrasound examinations of the pelvis were performed.  Transabdominal technique was performed for global imaging of the pelvis including uterus, ovaries, adnexal regions, and pelvic cul-de-sac. It was necessary to proceed with endovaginal exam following the transabdominal exam to visualize the endometrium and ovaries. Color and duplex Doppler ultrasound was utilized to evaluate blood flow to the ovaries. COMPARISON:  None. FINDINGS: Uterus Measurements: 6.8 x 3.2 x 3.9 cm = volume: 44 mL. No fibroids or other mass visualized. Endometrium Thickness: 2 mm.  No focal abnormality visualized. Right ovary Measurements: 2.9 x 2.3 x 2.7 cm = volume: 9.1 mL. Normal appearance/no adnexal mass. Left ovary Measurements:  Not visualized, however no adnexal mass identified. Pulsed Doppler evaluation of the right ovary demonstrates normal low-resistance arterial and venous waveforms. The left ovary was not visualized could not be evaluated by duplex Doppler. Other findings No abnormal free fluid. IMPRESSION: Normal appearance uterus and right ovary. No sonographic evidence for right ovarian torsion or other acute findings. Nonvisualization of left ovary, however no left adnexal mass identified. Electronically Signed   By: Danae OrleansJohn A Stahl M.D.   On: 06/13/2019 10:55    Procedures Procedures (including critical care time)  Medications Ordered in ED Medications  morphine 4 MG/ML injection 4 mg (4 mg Intravenous Given 06/13/19 0840)  fentaNYL (SUBLIMAZE) injection 50 mcg (50 mcg Intravenous Given 06/13/19 0959)  iohexol (OMNIPAQUE) 300 MG/ML solution 100 mL (100 mLs Intravenous Contrast Given 06/13/19 1122)     Initial Impression / Assessment and Plan / ED Course  I have reviewed the triage vital signs and the nursing notes.  Pertinent labs & imaging results that were available during my care of the patient were reviewed by me and considered in my medical decision making (see chart for details).  Clinical Course as of Jun 12 1326  Wynelle LinkSun Jun 13, 2019  [id number] Evaluated patient,  she has been here in ER for 45 min. Disrespectul using threatening and vulgar language towards me.  "you aren't even a doctor" "I have been waiting for 30 min and no body cares" "give me my paperwork and get the fuck out of here". Attempted to deescalate twice unsuccessful.  Will dc AMA.    [CG]  [phone] RN notified me patient would like to stay to be evaluated.  Spoke to patient again and asked her if she would be willing to start over our conversation. Patient no calmer, cooperative and respectful.    [CG]  0945 RBC / HPF: 21-50 [CG]  0945 Hgb urine dipstickMarland Kitchen(!): MODERATE [CG]  1106 Pt re-evaluated and updated no clinical decline pending CTAP   [CG]  [id number] 23 year old female with colicky right lower quadrant abdominal pain.  Had a pelvic ultrasound that did not show any evidence of torsion.  Has blood in her urine so got a CT abdomen pelvis showing a distal ureteral stone.  Likely discharge with pain control and urology follow-up.   [MB]  1323  IMPRESSION: Right UVJ calculus measures 2 mm. There is mild right hydroureter.    CT ABDOMEN PELVIS W CONTRAST [CG]    Clinical Course User Index [CG] Liberty HandyGibbons, Melodee Lupe J, PA-C [MB] Terrilee FilesButler, Michael C, MD   23 yo sexually active F here with acute, severe, waxing/waning RLQ abdominal pain and nausea. No urinary, vaginal or GI associated symptoms.   Ddx is broad for this patient including ovarian torsion, rupture ovarian cyst, kidney stone.  Less likely appendicitis given pain description.  Doubt right sided diverticulitis, SBO, dissection.    ER work up reviewed personally remarkable for 21-50 RBCs and rare bacteria in urine. No obvious UTI. WBC normal. hcg negative. Highest concern for vascular process/torsion, will start with pelvic US. Consider CT.  1325: Pelvic ultrasound is nonacute.  Repeat exam she has persistent right lower quadrant abdominal pain.  Fentanyl ordered.  CT A/P ordered and confirms 2 mm ureteral stone with mild hydronephrosis.   Creatinine normal.  No signs of UTI.  Pain has improved here.  She is voiding urine without difficulty.  Will discharge with Flomax, pain medicine, urology follow-up.  Return precautions given.  Patient comfortable with this.  Shared with EDP.  Final Clinical  Impressions(s) / ED Diagnoses   Final diagnoses:  Right ureteral stone    ED Discharge Orders         Ordered    tamsulosin (FLOMAX) 0.4 MG CAPS capsule  Daily     06/13/19 1325    oxyCODONE (OXY IR/ROXICODONE) 5 MG immediate release tablet  Every 4 hours PRN     06/13/19 1325           Liberty Handy, [place] 06/13/19 1327    Terrilee Files, MD 06/13/19 1726

## 2019-06-13 NOTE — ED Triage Notes (Signed)
Pt presents with c/o RLQ abdominal pain that started approx 1.5 hrs ago. Pt reports nausea, denies V/D. Pt is writhing in chair and crying in triage, reports pain 10/10.

## 2019-06-14 LAB — GC/CHLAMYDIA PROBE AMP (~~LOC~~) NOT AT ARMC
Chlamydia: NEGATIVE
Neisseria Gonorrhea: NEGATIVE

## 2020-03-07 ENCOUNTER — Emergency Department (HOSPITAL_COMMUNITY)
Admission: EM | Admit: 2020-03-07 | Discharge: 2020-03-08 | Disposition: A | Discharge status: Home / Self Care | Payer: Self-pay | Attending: Emergency Medicine | Admitting: Emergency Medicine

## 2020-03-07 ENCOUNTER — Emergency Department (HOSPITAL_COMMUNITY)
Admission: EM | Admit: 2020-03-07 | Discharge status: Absent without leave | Payer: Self-pay | Source: Home / Self Care | Attending: Emergency Medicine | Admitting: Emergency Medicine

## 2020-03-07 ENCOUNTER — Other Ambulatory Visit: Payer: Self-pay

## 2020-03-07 DIAGNOSIS — T402X1A Poisoning by other opioids, accidental (unintentional), initial encounter: Secondary | ICD-10-CM | POA: Insufficient documentation

## 2020-03-07 DIAGNOSIS — R000 Tachycardia, unspecified: Secondary | ICD-10-CM | POA: Insufficient documentation

## 2020-03-07 MED ORDER — SODIUM CHLORIDE 0.9 % IV BOLUS
1000.0000 mL | Freq: Once | INTRAVENOUS | Status: AC
  Administered 2020-03-07: 1000 mL via INTRAVENOUS

## 2020-03-07 NOTE — ED Notes (Signed)
Patty @ poison control recommends to monitor pt for 6 hours post-narcan administration. No recommendations were made for blood draw.

## 2020-03-07 NOTE — ED Triage Notes (Signed)
Patient arrived via [facility] after using fentanyl, given 4 mg narcan total prior to arrival. Declines SI

## 2020-03-08 LAB — RAPID URINE DRUG SCREEN, HOSP PERFORMED
Amphetamines: NOT DETECTED
Barbiturates: NOT DETECTED
Benzodiazepines: NOT DETECTED
Cocaine: NOT DETECTED
Opiates: NOT DETECTED
Tetrahydrocannabinol: POSITIVE — AB

## 2020-03-08 LAB — PREGNANCY, URINE: Preg Test, Ur: NEGATIVE

## 2020-03-08 MED ORDER — SODIUM CHLORIDE 0.9 % IV BOLUS
1000.0000 mL | Freq: Once | INTRAVENOUS | Status: AC
  Administered 2020-03-08: 1000 mL via INTRAVENOUS

## 2020-03-08 NOTE — Discharge Instructions (Addendum)
You were seen today after an opiate overdose.  You could have died.  You should not use illicit drugs.  See resources provided.

## 2020-03-08 NOTE — ED Notes (Addendum)
Pt re-evaluated by Dr. Wilkie Aye and is cleared to be discharged. Pt is A/Ox3. Denies pain, SHOB, dizziness or any other complaints. Denies SI or HI. Calm and cooperative. Sipped on water without difficulty. No n/v noted. Skin w/d/pink. Resp wnl, equal and non-labored. VSS. IV d/c'd and intact. Bleeding controlled. Pt dressed self and is independently ambulatory. Gait steady. Declined w/c.

## 2020-03-08 NOTE — ED Provider Notes (Signed)
[facility]-EMERGENCY DEPT Provider Note   CSN: [id number] Arrival date & time: 03/07/20  2323     History No chief complaint on file.   Mary Waller is a 24 y.o. female.  HPI     This is a 24 year old female with no reported past medical history who presents following an overdose.  Patient reports that she "took too much fentanyl."  She reports that she snorted fentanyl.  She denies SI and states that it was unintentional.  She has never had Narcan before.  She denies daily opiate abuse but does state that she uses frequently.  She also smoked a blunt prior to this episode.  She denies any physical complaints at this time including chest pain, shortness breath, abdominal pain, nausea, vomiting.  She received 4 mg of Narcan by EMS.  She is awake, alert, oriented and able to provide history.  No past medical history on file.  There are no problems to display for this patient.   No significant PMH  OB History   No obstetric history on file.     No family history on file.  Social History   Tobacco Use  . Smoking status: Not on file  Substance Use Topics  . Alcohol use: Not on file  . Drug use: Not on file    Home Medications Prior to Admission medications   Not on File    Allergies    Patient has no known allergies.  Review of Systems   Review of Systems  Constitutional: Negative for fever.  Respiratory: Negative for shortness of breath.   Gastrointestinal: Negative for abdominal pain, nausea and vomiting.  Genitourinary: Negative for dysuria.  All other systems reviewed and are negative.   Physical Exam Updated Vital Signs BP 91/66   Pulse (!) 120   Temp 98.1 F (36.7 C) (Oral)   Resp 15   SpO2 99%   Physical Exam Vitals and nursing note reviewed.  Constitutional:      Appearance: She is well-developed.  HENT:     Head: Normocephalic and atraumatic.     Mouth/Throat:     Mouth: Mucous membranes are moist.  Eyes:      Pupils: Pupils are equal, round, and reactive to light.     Comments: 3 mm reactive bilaterally  Cardiovascular:     Rate and Rhythm: Normal rate and regular rhythm.     Heart sounds: Normal heart sounds.  Pulmonary:     Effort: Pulmonary effort is normal. No respiratory distress.     Breath sounds: No wheezing.  Abdominal:     General: Bowel sounds are normal.     Palpations: Abdomen is soft.     Tenderness: There is no abdominal tenderness.  Musculoskeletal:     Cervical back: Neck supple.  Skin:    General: Skin is warm and dry.  Neurological:     Mental Status: She is alert and oriented to person, place, and time.  Psychiatric:        Mood and Affect: Mood normal.     ED Results / Procedures / Treatments   Labs (all labs ordered are listed, but only abnormal results are displayed) Labs Reviewed  RAPID URINE DRUG SCREEN, HOSP PERFORMED - Abnormal; Notable for the following components:      Result Value   Tetrahydrocannabinol POSITIVE (*)    All other components within normal limits  PREGNANCY, URINE    EKG EKG Interpretation  Date/Time:  Tuesday March 07 2020  23:38:44 EDT Ventricular Rate:  134 PR Interval:    QRS Duration: 91 QT Interval:  308 QTC Calculation: 460 R Axis:   107 Text Interpretation: Sinus tachycardia Consider right atrial enlargement Borderline right axis deviation Confirmed by Ross Marcus ([id number]) on 03/08/2020 12:04:41 AM   Radiology No results found.  Procedures Procedures (including critical care time)  Medications Ordered in ED Medications  sodium chloride 0.9 % bolus 1,000 mL (0 mLs Intravenous Stopped 03/08/20 0140)  sodium chloride 0.9 % bolus 1,000 mL (0 mLs Intravenous Stopped 03/08/20 0302)    ED Course  I have reviewed the triage vital signs and the nursing notes.  Pertinent labs & imaging results that were available during my care of the patient were reviewed by me and considered in my medical decision making (see chart for  details).  Clinical Course as of Mar 08 305  Wed Mar 08, 2020  0228 Tachycardia improved however, blood pressures are soft.  She continues to be awake and alert.  Respirations 22.  Will give a second liter of fluids.   [CH]  0228 Patient awake and alert.  Has not required any further Narcan.  Blood pressure improved and is now 98 systolic.  Requesting discharge home.   [CH]    Clinical Course User Index [CH] Nnaemeka Samson, Mayer Masker, MD   MDM Rules/Calculators/A&P                           Patient presents with an unintentional fentanyl overdose.  Required Narcan by EMS.  She is awake, alert, and oriented.  She is tachycardic but otherwise vital signs are initially reassuring.  UDS is positive for marijuana.  EKG shows sinus tachycardia with no acute ischemia or arrhythmia.  Patient was monitored for greater than 3 hours without recurrent need for Narcan.  She did have some soft blood pressures but she is young and thin.  She received 2 L of fluids in the stabilize.  She returned to her baseline.  We discussed the risk of illicit drug use and the fact that she could have died.  She was provided with resources for outpatient counseling and drug rehabilitation.  After history, exam, and medical workup I feel the patient has been appropriately medically screened and is safe for discharge home. Pertinent diagnoses were discussed with the patient. Patient was given return precautions.   Final Clinical Impression(s) / ED Diagnoses Final diagnoses:  Opioid overdose, accidental or unintentional, initial encounter [facility])    Rx / DC Orders ED Discharge Orders    None       Shon Baton, MD 03/08/20 [phone]

## 2020-03-09 IMAGING — US US ART/VEN ABD/PELV/SCROTUM DOPPLER LTD
1 series · 13 of 25 positions shown · non-contrast
Comparison: None.

CLINICAL DATA: Acute onset of right pelvic pain 3 hours ago.
Clinical suspicion for ovarian torsion.

EXAM:
TRANSABDOMINAL AND TRANSVAGINAL ULTRASOUND OF PELVIS
DOPPLER ULTRASOUND OF OVARIES
TECHNIQUE: Both transabdominal and transvaginal ultrasound examinations of the
pelvis were performed. Transabdominal technique was performed for
global imaging of the pelvis including uterus, ovaries, adnexal
regions, and pelvic cul-de-sac.
It was necessary to proceed with endovaginal exam following the
transabdominal exam to visualize the endometrium and ovaries. Color
and duplex Doppler ultrasound was utilized to evaluate blood flow to
the ovaries.

[Series 1: us art/ven abd/pelv/scrotum doppler ltd · 50 acquisitions, 13 frames shown]
[im 1/50]
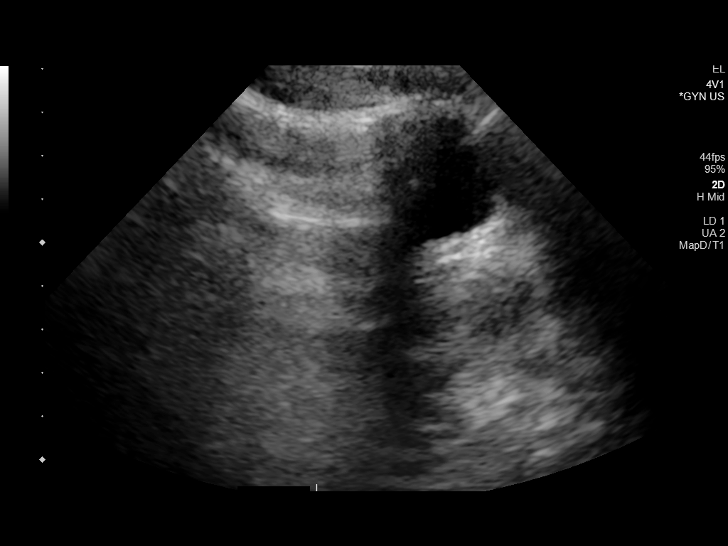
[im 5/50]
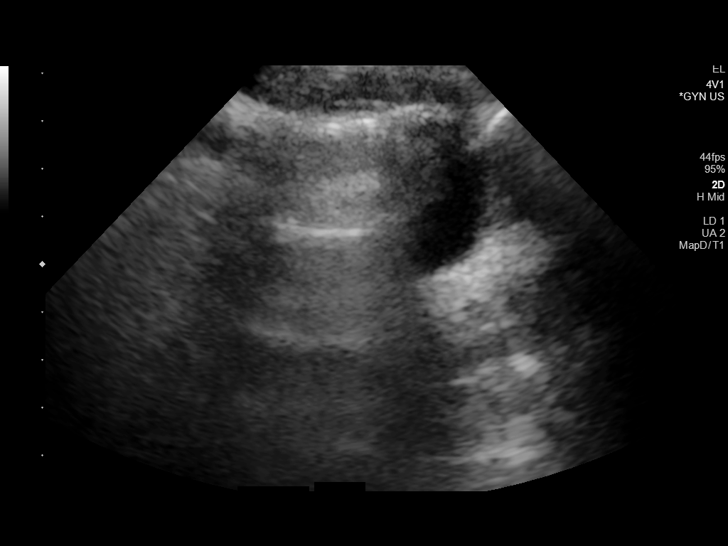
[im 9/50]
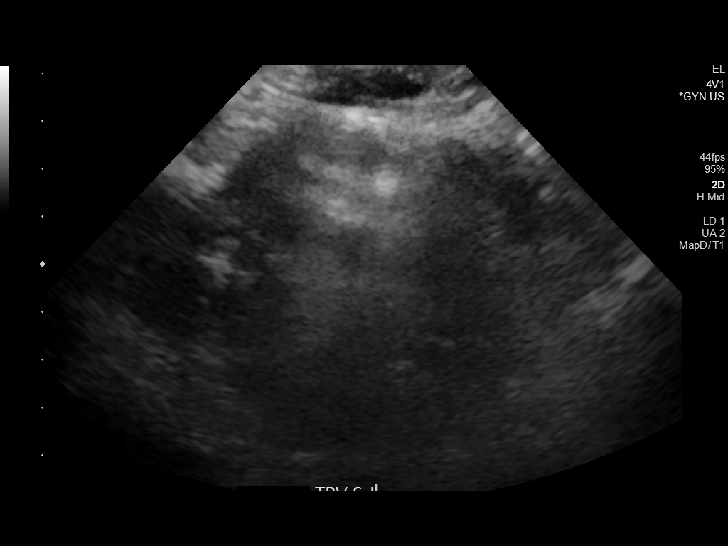
[im 13/50]
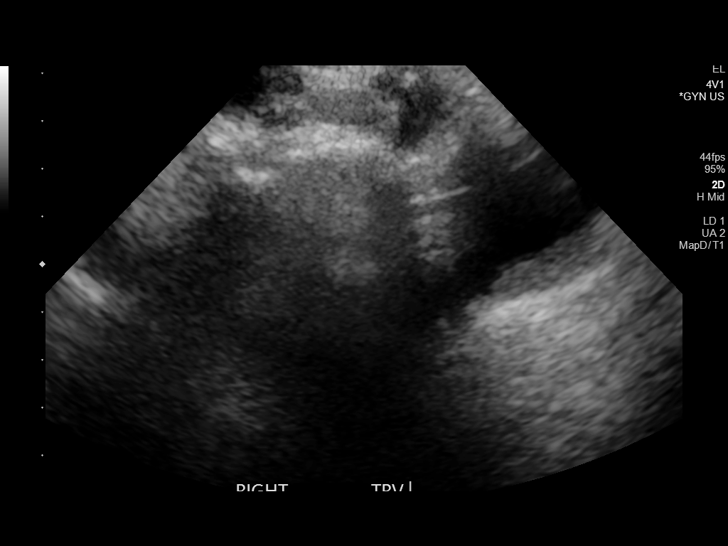
[im 17/50]
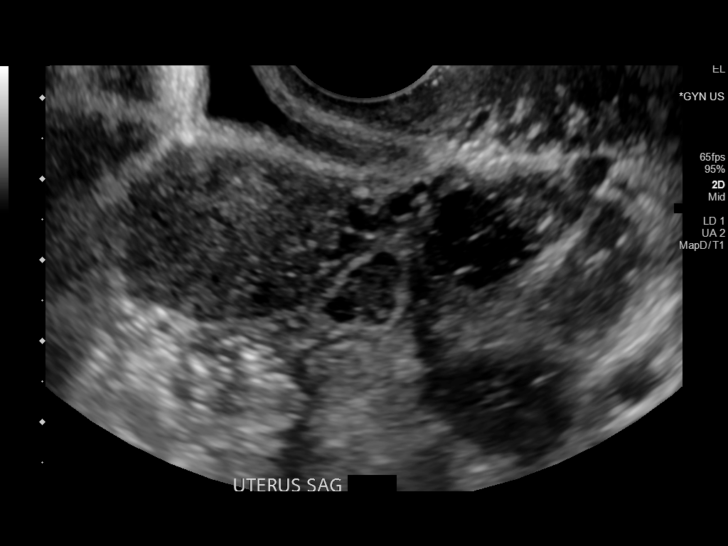
[im 21/50]
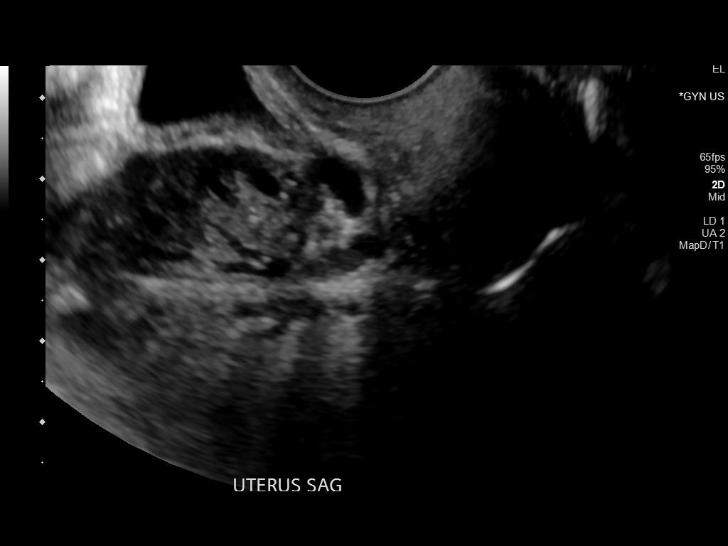
[im 25/50]
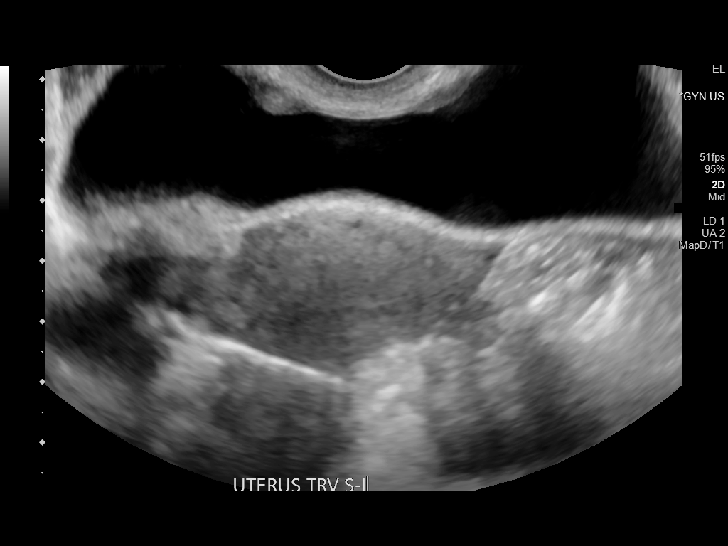
[im 29/50]
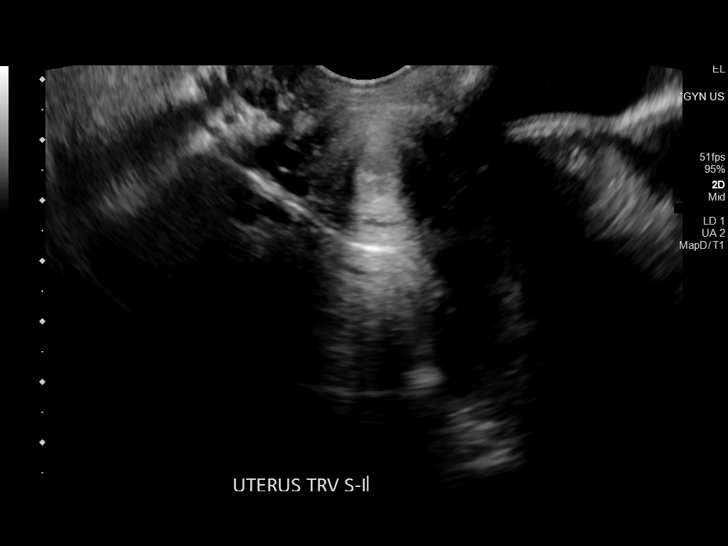
[im 33/50]
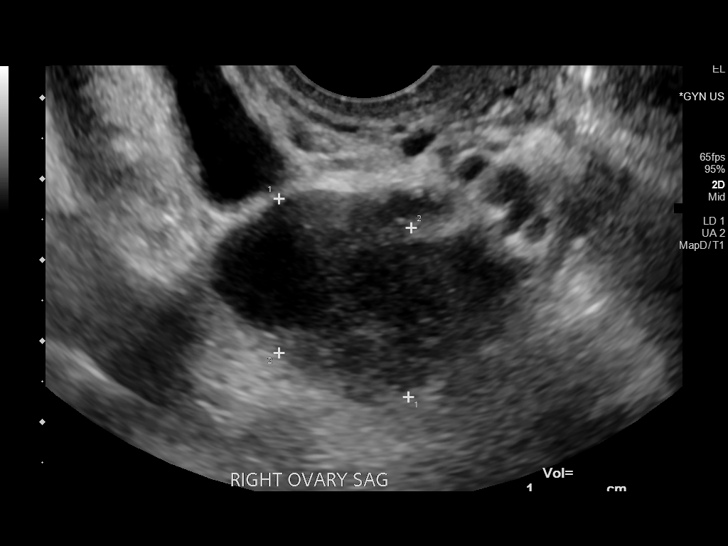
[im 37/50]
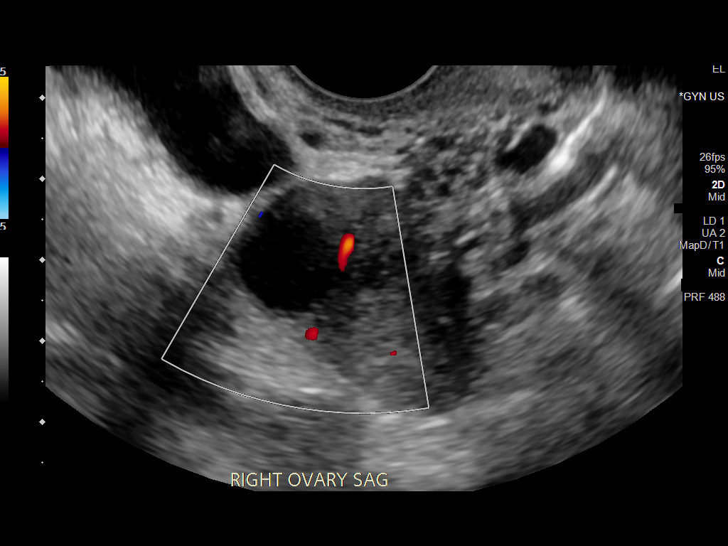
[im 41/50]
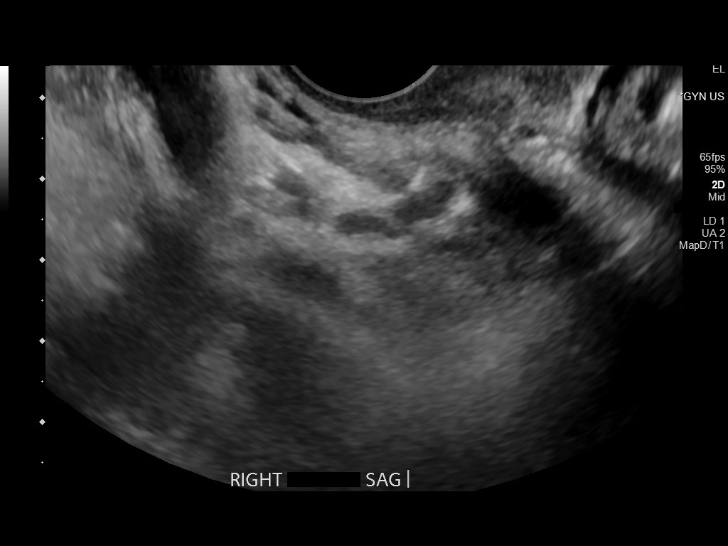
[im 45/50]
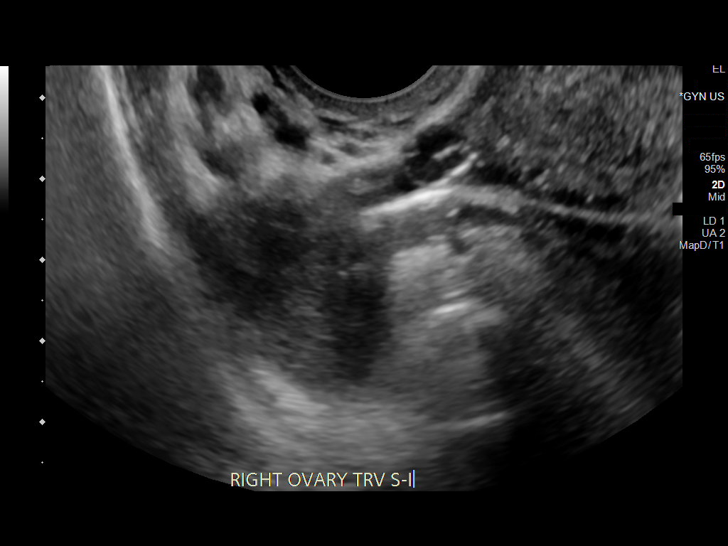
[im 50/50]
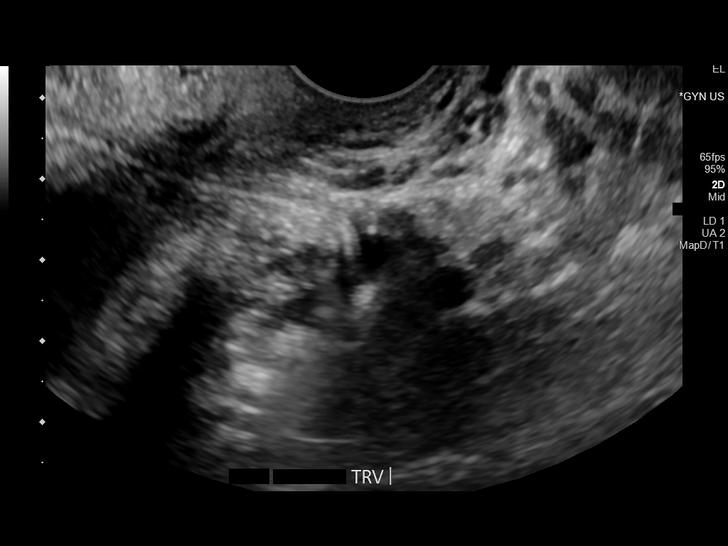

[13 of 25 positions shown; findings below may reference images not displayed]

FINDINGS: Uterus

Measurements: 6.8 x 3.2 x 3.9 cm = volume: 44 mL. No fibroids or
other mass visualized.

Endometrium

Thickness: 2 mm.  No focal abnormality visualized.

Right ovary

Measurements: 2.9 x 2.3 x 2.7 cm = volume: 9.1 mL. Normal
appearance/no adnexal mass.

Left ovary

Measurements: Not visualized, however no adnexal mass identified.

Pulsed Doppler evaluation of the right ovary demonstrates normal
low-resistance arterial and venous waveforms. The left ovary was not
visualized could not be evaluated by duplex Doppler.

Other findings

No abnormal free fluid.
IMPRESSION: Normal appearance uterus and right ovary. No sonographic evidence
for right ovarian torsion or other acute findings.

Nonvisualization of left ovary, however no left adnexal mass
identified.

## 2020-03-09 IMAGING — US US PELVIS COMPLETE WITH TRANSVAGINAL
1 series · 13 of 25 positions shown · non-contrast
Comparison: None.

CLINICAL DATA: Acute onset of right pelvic pain 3 hours ago.
Clinical suspicion for ovarian torsion.

EXAM:
TRANSABDOMINAL AND TRANSVAGINAL ULTRASOUND OF PELVIS
DOPPLER ULTRASOUND OF OVARIES
TECHNIQUE: Both transabdominal and transvaginal ultrasound examinations of the
pelvis were performed. Transabdominal technique was performed for
global imaging of the pelvis including uterus, ovaries, adnexal
regions, and pelvic cul-de-sac.
It was necessary to proceed with endovaginal exam following the
transabdominal exam to visualize the endometrium and ovaries. Color
and duplex Doppler ultrasound was utilized to evaluate blood flow to
the ovaries.

[Series 1: us pelvis complete with transvaginal · 50 acquisitions, 13 frames shown]
[im 1/50]
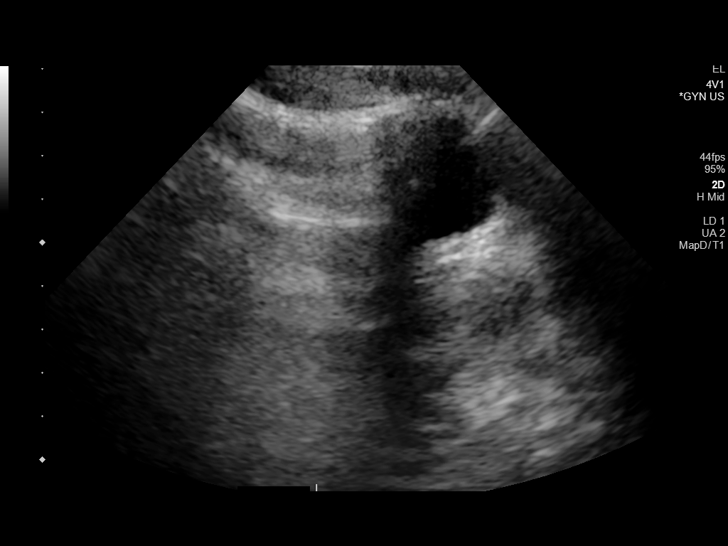
[im 5/50]
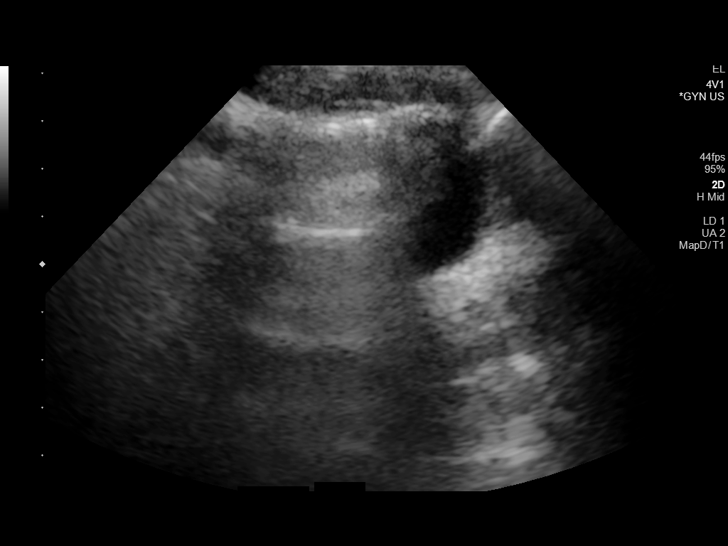
[im 9/50]
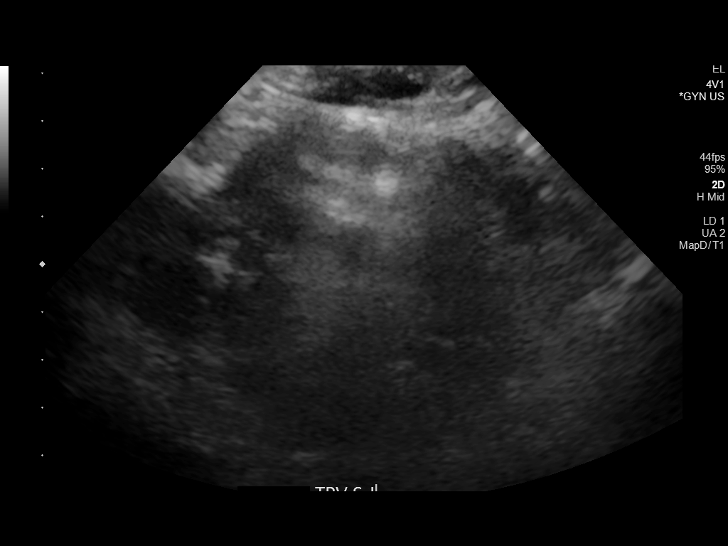
[im 13/50]
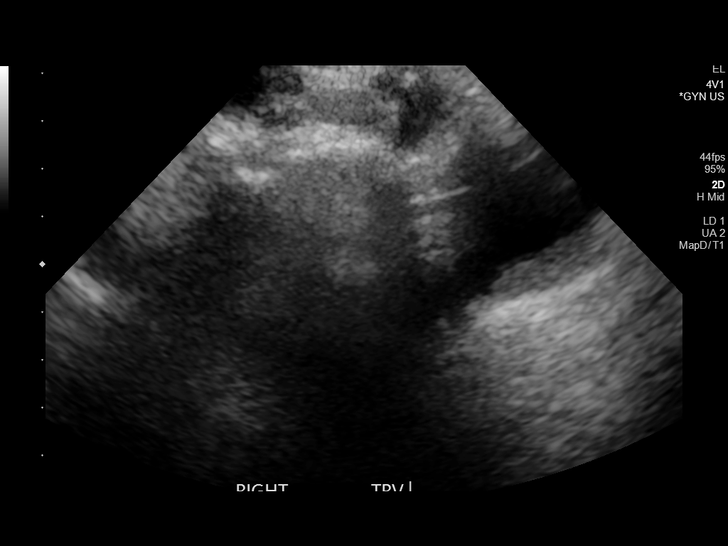
[im 17/50]
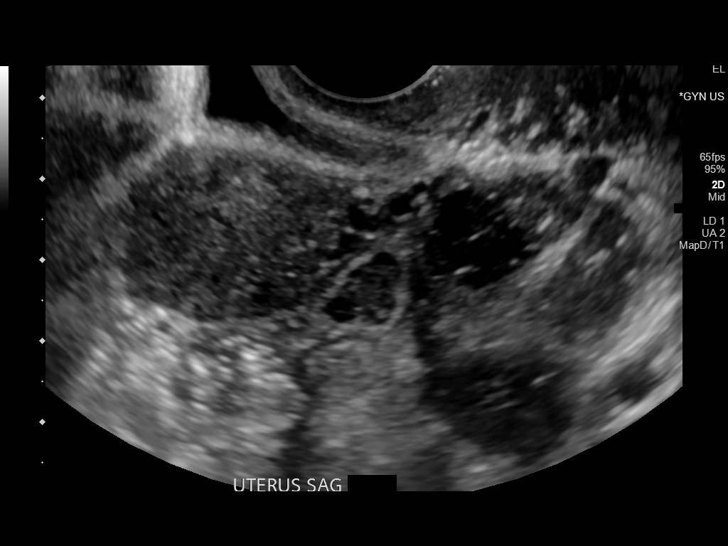
[im 21/50]
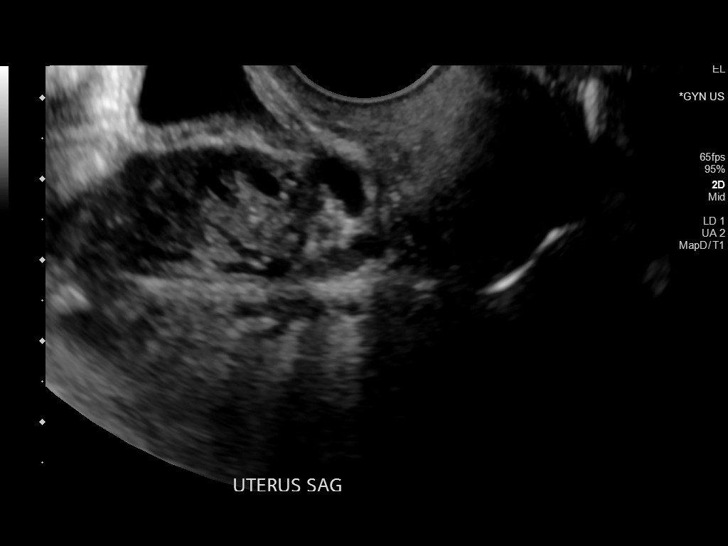
[im 25/50]
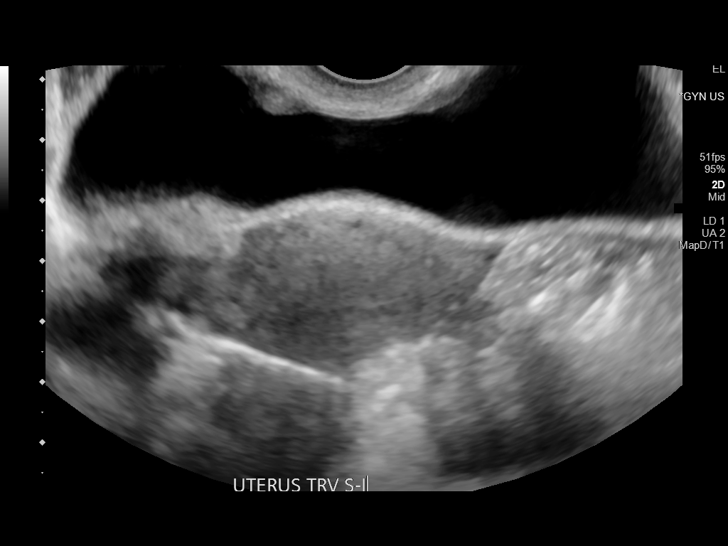
[im 29/50]
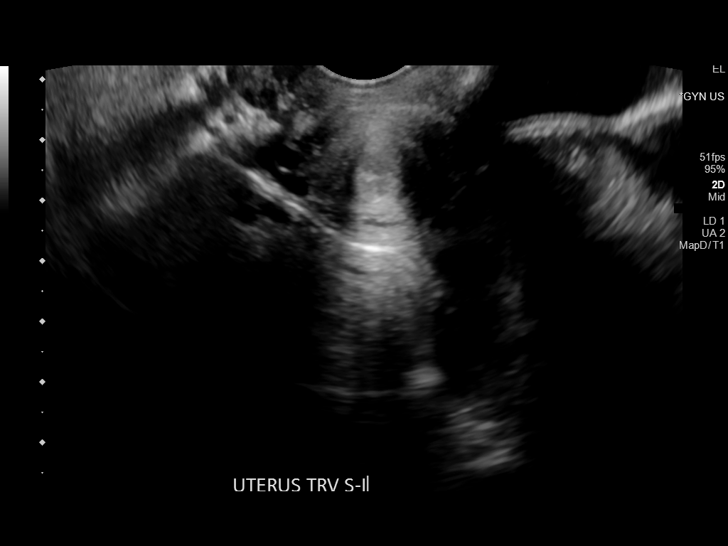
[im 33/50]
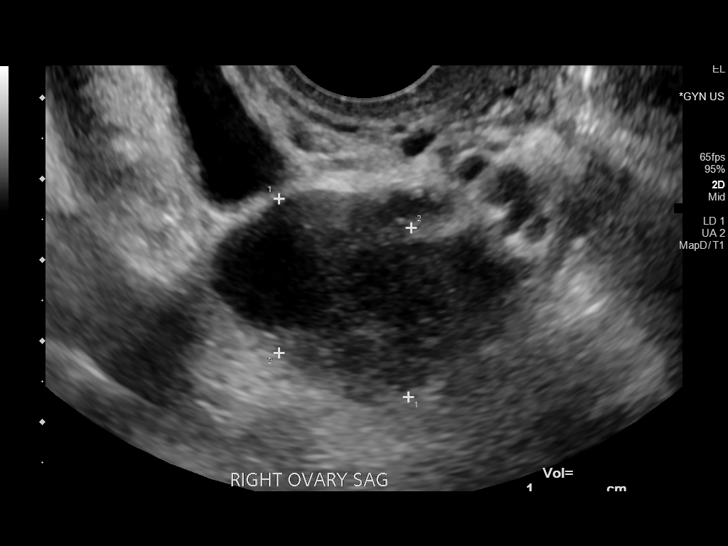
[im 37/50]
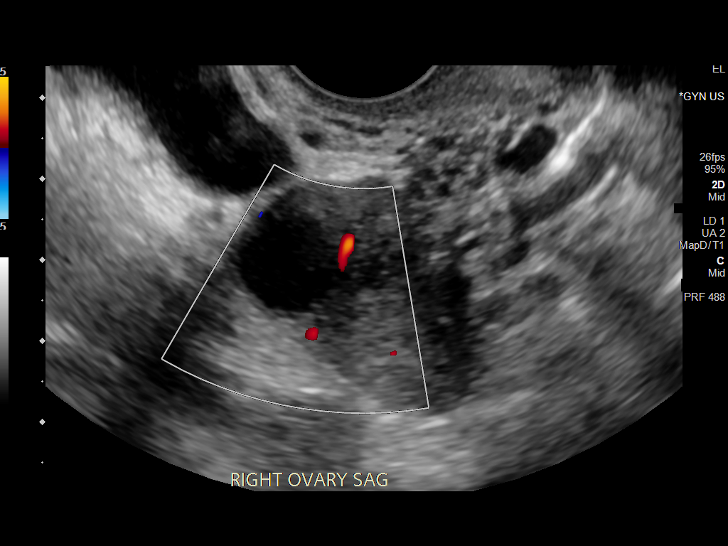
[im 41/50]
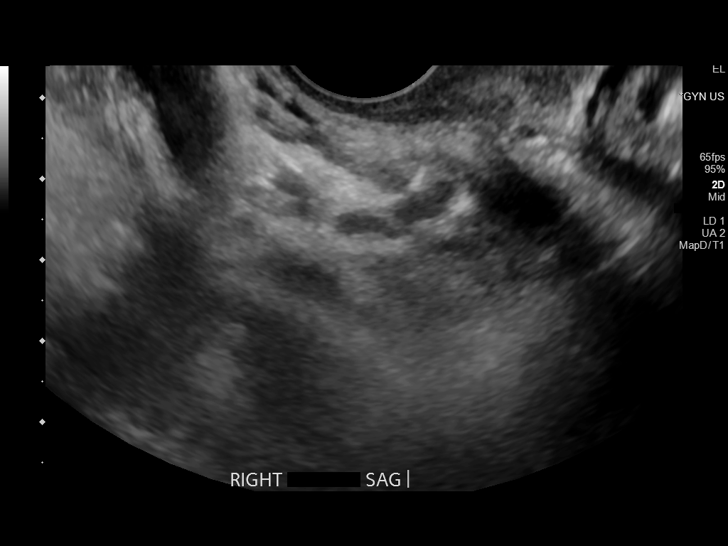
[im 45/50]
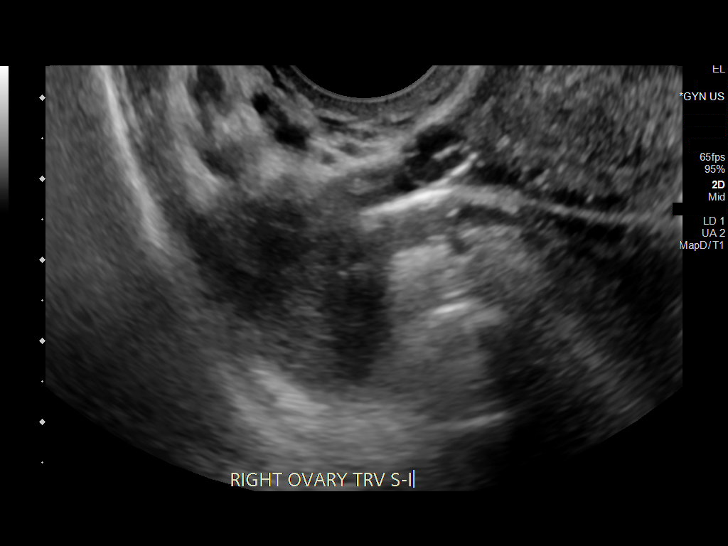
[im 50/50]
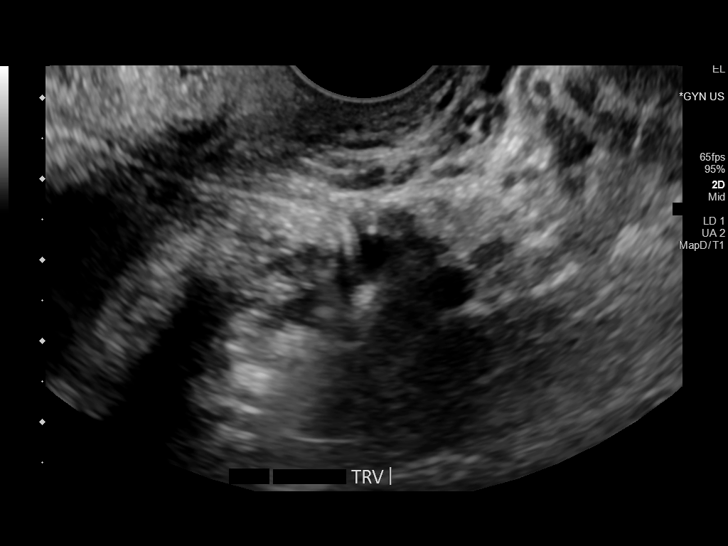

[13 of 25 positions shown; findings below may reference images not displayed]

FINDINGS: Uterus

Measurements: 6.8 x 3.2 x 3.9 cm = volume: 44 mL. No fibroids or
other mass visualized.

Endometrium

Thickness: 2 mm.  No focal abnormality visualized.

Right ovary

Measurements: 2.9 x 2.3 x 2.7 cm = volume: 9.1 mL. Normal
appearance/no adnexal mass.

Left ovary

Measurements: Not visualized, however no adnexal mass identified.

Pulsed Doppler evaluation of the right ovary demonstrates normal
low-resistance arterial and venous waveforms. The left ovary was not
visualized could not be evaluated by duplex Doppler.

Other findings

No abnormal free fluid.
IMPRESSION: Normal appearance uterus and right ovary. No sonographic evidence
for right ovarian torsion or other acute findings.

Nonvisualization of left ovary, however no left adnexal mass
identified.

## 2021-02-05 ENCOUNTER — Encounter: Payer: Self-pay | Admitting: Family Medicine

## 2023-04-16 DIAGNOSIS — R110 Nausea: Secondary | ICD-10-CM | POA: Diagnosis not present

## 2023-04-16 DIAGNOSIS — R079 Chest pain, unspecified: Secondary | ICD-10-CM | POA: Diagnosis not present

## 2023-04-16 DIAGNOSIS — B349 Viral infection, unspecified: Secondary | ICD-10-CM | POA: Diagnosis not present

## 2023-04-16 DIAGNOSIS — R059 Cough, unspecified: Secondary | ICD-10-CM | POA: Diagnosis not present

## 2023-04-30 ENCOUNTER — Encounter (HOSPITAL_BASED_OUTPATIENT_CLINIC_OR_DEPARTMENT_OTHER): Payer: Self-pay | Admitting: Emergency Medicine

## 2023-04-30 ENCOUNTER — Emergency Department (HOSPITAL_BASED_OUTPATIENT_CLINIC_OR_DEPARTMENT_OTHER): Payer: 59

## 2023-04-30 ENCOUNTER — Emergency Department (HOSPITAL_BASED_OUTPATIENT_CLINIC_OR_DEPARTMENT_OTHER)
Admission: EM | Admit: 2023-04-30 | Discharge: 2023-04-30 | Disposition: A | Discharge status: Home / Self Care | Payer: 59 | Attending: Emergency Medicine | Admitting: Emergency Medicine

## 2023-04-30 ENCOUNTER — Other Ambulatory Visit: Payer: Self-pay

## 2023-04-30 DIAGNOSIS — D72829 Elevated white blood cell count, unspecified: Secondary | ICD-10-CM | POA: Insufficient documentation

## 2023-04-30 DIAGNOSIS — R1031 Right lower quadrant pain: Secondary | ICD-10-CM | POA: Diagnosis not present

## 2023-04-30 LAB — COMPREHENSIVE METABOLIC PANEL
ALT: 25 U/L (ref 0–44)
AST: 21 U/L (ref 15–41)
Albumin: 4.3 g/dL (ref 3.5–5.0)
Alkaline Phosphatase: 62 U/L (ref 38–126)
Anion gap: 9 (ref 5–15)
BUN: 12 mg/dL (ref 6–20)
CO2: 26 mmol/L (ref 22–32)
Calcium: 9.7 mg/dL (ref 8.9–10.3)
Chloride: 103 mmol/L (ref 98–111)
Creatinine, Ser: 1.33 mg/dL — ABNORMAL HIGH (ref 0.44–1.00)
GFR, Estimated: 57 mL/min — ABNORMAL LOW (ref >60)
Glucose, Bld: 127 mg/dL — ABNORMAL HIGH (ref 70–99)
Potassium: 3.8 mmol/L (ref 3.5–5.1)
Sodium: 138 mmol/L (ref 135–145)
Total Bilirubin: 0.6 mg/dL (ref 0.3–1.2)
Total Protein: 7 g/dL (ref 6.5–8.1)

## 2023-04-30 LAB — CBC
HCT: 41.2 % (ref 36.0–46.0)
Hemoglobin: 14.2 g/dL (ref 12.0–15.0)
MCH: 31.5 pg (ref 26.0–34.0)
MCHC: 34.5 g/dL (ref 30.0–36.0)
MCV: 91.4 fL (ref 80.0–100.0)
Platelets: 305 10*3/uL (ref 150–400)
RBC: 4.51 MIL/uL (ref 3.87–5.11)
RDW: 12.5 % (ref 11.5–15.5)
WBC: 16.3 10*3/uL — ABNORMAL HIGH (ref 4.0–10.5)
nRBC: 0 % (ref 0.0–0.2)

## 2023-04-30 LAB — LIPASE, BLOOD: Lipase: <10 U/L — ABNORMAL LOW (ref 11–51)

## 2023-04-30 LAB — PREGNANCY, URINE: Preg Test, Ur: NEGATIVE

## 2023-04-30 MED ORDER — CEPHALEXIN 500 MG PO CAPS: 500.0000 mg | ORAL_CAPSULE | Freq: Three times a day (TID) | ORAL | 0 refills | Status: AC

## 2023-04-30 MED ORDER — ONDANSETRON 4 MG PO TBDP: ORAL_TABLET | ORAL | 0 refills | Status: DC

## 2023-04-30 MED ORDER — IOHEXOL 300 MG/ML  SOLN
100.0000 mL | Freq: Once | INTRAMUSCULAR | Status: AC | PRN
  Administered 2023-04-30: 85 mL via INTRAVENOUS

## 2023-04-30 NOTE — ED Notes (Signed)
Pt gone to ultrasound transported by tech [facility]

## 2023-04-30 NOTE — ED Notes (Addendum)
Pt called at this time and this RN asked if Urine was available in lab for urine culture, lab tech reports it is.

## 2023-04-30 NOTE — ED Notes (Signed)
Pt ambulated to the restroom with even steady gait, no apparent distress.

## 2023-04-30 NOTE — ED Notes (Signed)
Pt discharged in stable condition. Pt and mother expressed understanding about discharge instructions and Rx, and to follow up with pcp and to return to ER for any further concerns or complications. Pt ambulated out with even steady gait,no apparent distress.

## 2023-04-30 NOTE — ED Provider Notes (Signed)
[place] EMERGENCY DEPARTMENT AT [facility] Provider Note   CSN: [id number] Arrival date & time: 04/30/23  4098     History  Chief Complaint  Patient presents with   Abdominal Pain    RLQ    Mary Waller is a 27 y.o. female.  Patient presents with right lower quadrant pain that woke her from sleep last night.  Patient has history of kidney stone in the past this feels different.  Patient had nausea and vomiting 5 times last night pain radiates to the groin.  Patient sent over from urgent care for further imaging.  Patient's pain and symptoms improved since receiving Toradol and Zofran at urgent care.  The history is provided by the patient.  Abdominal Pain Associated symptoms: nausea and vomiting   Associated symptoms: no chest pain, no chills, no dysuria, no fever and no shortness of breath        Home Medications Prior to Admission medications   Medication Sig Start Date End Date Taking? Authorizing Provider  benzonatate (TESSALON) 100 MG capsule Take 100 mg by mouth 3 (three) times daily as needed for cough. 04/16/23  Yes [provider]  cephALEXin (KEFLEX) 500 MG capsule Take 1 capsule (500 mg total) by mouth 3 (three) times daily for 7 days. 04/30/23 05/07/23 Yes Blane Ohara, MD  dexamethasone (DECADRON) 6 MG tablet Take 6 mg by mouth every morning. 04/16/23  Yes [provider]  ondansetron (ZOFRAN-ODT) 4 MG disintegrating tablet 4mg  ODT q6 hours prn nausea/vomit 04/30/23  Yes Blane Ohara, MD  DULoxetine (CYMBALTA) 30 MG capsule Take 1 capsule (30 mg total) by mouth daily. Patient not taking: Reported on 06/13/2019 08/27/17 11/25/17  Trey Sailors, PA-C  etonogestrel (NEXPLANON) 68 MG IMPL implant 1 each by Subdermal route once.    [provider]  ibuprofen (ADVIL) 600 MG tablet Take 1 tablet (600 mg total) by mouth every 6 (six) hours as needed. Patient not taking: Reported on 06/13/2019 12/28/18   Enid Derry, PA-C   ondansetron (ZOFRAN) 4 MG tablet Take 1 tablet (4 mg total) by mouth every 8 (eight) hours as needed for nausea or vomiting. Patient not taking: Reported on 06/13/2019 09/04/17   Trey Sailors, PA-C  ondansetron (ZOFRAN-ODT) 4 MG disintegrating tablet Take 4 mg by mouth every 8 (eight) hours as needed.    [provider]      Allergies    Patient has no known allergies.    Review of Systems   Review of Systems  Constitutional:  Negative for chills and fever.  HENT:  Negative for congestion.   Eyes:  Negative for visual disturbance.  Respiratory:  Negative for shortness of breath.   Cardiovascular:  Negative for chest pain.  Gastrointestinal:  Positive for abdominal pain, nausea and vomiting.  Genitourinary:  Negative for dysuria and flank pain.  Musculoskeletal:  Negative for back pain, neck pain and neck stiffness.  Skin:  Negative for rash.  Neurological:  Negative for light-headedness and headaches.    Physical Exam Updated Vital Signs BP 117/70   Pulse 61   Temp 98.2 F (36.8 C) (Oral)   Resp 18   Wt 86.2 kg   LMP 04/16/2023 (Approximate)   SpO2 96%   BMI 32.61 kg/m  Physical Exam Vitals and nursing note reviewed.  Constitutional:      General: She is not in acute distress.    Appearance: She is well-developed.  HENT:     Head: Normocephalic and atraumatic.  Mouth/Throat:     Mouth: Mucous membranes are moist.  Eyes:     General:        Right eye: No discharge.        Left eye: No discharge.     Conjunctiva/sclera: Conjunctivae normal.  Neck:     Trachea: No tracheal deviation.  Cardiovascular:     Rate and Rhythm: Normal rate and regular rhythm.     Heart sounds: No murmur heard. Pulmonary:     Effort: Pulmonary effort is normal.     Breath sounds: Normal breath sounds.  Abdominal:     General: There is no distension.     Palpations: Abdomen is soft.     Tenderness: There is abdominal tenderness in the right lower quadrant. There is no  guarding.  Musculoskeletal:     Cervical back: Normal range of motion and neck supple. No rigidity.  Skin:    General: Skin is warm.     Capillary Refill: Capillary refill takes less than 2 seconds.     Findings: No rash.  Neurological:     General: No focal deficit present.     Mental Status: She is alert.     Cranial Nerves: No cranial nerve deficit.  Psychiatric:        Mood and Affect: Mood normal.     ED Results / Procedures / Treatments   Labs (all labs ordered are listed, but only abnormal results are displayed) Labs Reviewed  LIPASE, BLOOD - Abnormal; Notable for the following components:      Result Value   Lipase <10 (*)    All other components within normal limits  COMPREHENSIVE METABOLIC PANEL - Abnormal; Notable for the following components:   Glucose, Bld 127 (*)    Creatinine, Ser 1.33 (*)    GFR, Estimated 57 (*)    All other components within normal limits  CBC - Abnormal; Notable for the following components:   WBC 16.3 (*)    All other components within normal limits  URINE CULTURE  PREGNANCY, URINE    EKG None  Radiology CT ABDOMEN PELVIS W CONTRAST  Result Date: 04/30/2023 CLINICAL DATA:  Right lower quadrant pain. Nausea and emesis. Back pain EXAM: CT ABDOMEN AND PELVIS WITH CONTRAST TECHNIQUE: Multidetector CT imaging of the abdomen and pelvis was performed using the standard protocol following bolus administration of intravenous contrast. RADIATION DOSE REDUCTION: This exam was performed according to the departmental dose-optimization program which includes automated exposure control, adjustment of the mA and/or kV according to patient size and/or use of iterative reconstruction technique. CONTRAST:  85mL OMNIPAQUE IOHEXOL 300 MG/ML  SOLN COMPARISON:  CT 06/13/2019 FINDINGS: Lower chest: There is some linear opacity lung bases likely scar or atelectasis. No pleural effusion. Hepatobiliary: No focal liver abnormality is seen. No gallstones,  gallbladder wall thickening, or biliary dilatation. Pancreas: Unremarkable. No pancreatic ductal dilatation or surrounding inflammatory changes. Spleen: Normal in size without focal abnormality. Adrenals/Urinary Tract: Adrenal glands are preserved. The kidneys show heterogeneous patchy enhancement with perinephric stranding. No collecting system dilatation. The ureters have normal course and caliber extending down to the bladder. Bladder is underdistended. Stomach/Bowel: Stomach is within normal limits. Appendix appears normal. No evidence of bowel wall thickening, distention, or inflammatory changes. Vascular/Lymphatic: No significant vascular findings are present. No enlarged abdominal or pelvic lymph nodes. Reproductive: Uterus and bilateral adnexa are unremarkable. Other: No free air or free fluid. Small fat containing umbilical hernia. Musculoskeletal: Slight curvature of the spine. IMPRESSION: No bowel  obstruction, free air or free fluid.  Normal appendix. There is heterogeneous hazy enhancement of the kidneys with some adjacent stranding. This has a differential. This could be seen with pyelonephritis or infection. With patient's history of a negative UA there are other differential points. Please correlate for specific clinical presentation and follow-up Electronically Signed   By: Karen Kays M.D.   On: 04/30/2023 11:50   US PELVIC COMPLETE W TRANSVAGINAL AND TORSION R/O  Result Date: 04/30/2023 CLINICAL DATA:  Right lower quadrant abdominal pain. EXAM: TRANSABDOMINAL AND TRANSVAGINAL ULTRASOUND OF PELVIS DOPPLER ULTRASOUND OF OVARIES TECHNIQUE: Both transabdominal and transvaginal ultrasound examinations of the pelvis were performed. Transabdominal technique was performed for global imaging of the pelvis including uterus, ovaries, adnexal regions, and pelvic cul-de-sac. It was necessary to proceed with endovaginal exam following the transabdominal exam to visualize the endometrium and adnexa. Color  and duplex Doppler ultrasound was utilized to evaluate blood flow to the ovaries. COMPARISON:  Ultrasound 06/13/2019 FINDINGS: Uterus Measurements: 5.6 x 2.7 x 3.6 cm = volume: 28.1 mL. No fibroids or other mass visualized. Endometrium Thickness: 6 mm.  No focal abnormality visualized. Right ovary Measurements: 2.9 x 1.7 x 1.3 cm = volume: 3.4 mL. Normal appearance/no adnexal mass. Left ovary Measurements: 2.7 x 1.4 x 1.6 cm = volume: 3.2 mL. Normal appearance/no adnexal mass. Pulsed Doppler evaluation of both ovaries demonstrates normal low-resistance arterial and venous waveforms. Other findings No abnormal free fluid. Transabdominal images are limited by overlapping bowel gas and soft tissue. IMPRESSION: Unremarkable pelvic ultrasound.  No free fluid. Electronically Signed   By: Karen Kays M.D.   On: 04/30/2023 11:41    Procedures Procedures    Medications Ordered in ED Medications  iohexol (OMNIPAQUE) 300 MG/ML solution 100 mL (85 mLs Intravenous Contrast Given 04/30/23 1059)    ED Course/ Medical Decision Making/ A&P                                 Medical Decision Making Amount and/or Complexity of Data Reviewed Labs: ordered. Radiology: ordered.  Risk Prescription drug management.   Patient with history of kidney stone presents with right lower quadrant abdominal pain and vomiting differential includes recurrent kidney stone, ovarian cyst rupture, ovarian torsion, appendicitis, lymphadenopathy, other.  Plan for blood work, urine pregnancy test, ultrasound and CT scan.  Patient comfortable plan and symptoms currently controlled.  Blood work reviewed independently mild leukocytosis 16,000, electrolytes and hemoglobin unremarkable no signs significant anemia.  Ultrasound results independently reviewed no signs of torsion or ovarian cyst.  CT scan results independent reviewed normal appendix no signs of bowel obstruction, nonspecific haziness around the kidney.  Reviewed medical  records care everywhere patient had urinalysis done this morning no signs of significant infection.  This could be mild pyelonephritis given the CT abnormality.  Plan for urine culture to be sent, Keflex and recheck on Friday.  Patient will need close follow-up and recheck outpatient.  Patient stable for discharge.  Patient denies concerns for STD.       Final Clinical Impression(s) / ED Diagnoses Final diagnoses:  Right lower quadrant abdominal pain    Rx / DC Orders ED Discharge Orders          Ordered    ondansetron (ZOFRAN-ODT) 4 MG disintegrating tablet        04/30/23 1242    cephALEXin (KEFLEX) 500 MG capsule  3 times daily  04/30/23 1243              Blane Ohara, MD 04/30/23 1309

## 2023-04-30 NOTE — ED Triage Notes (Signed)
RLQ pain started last night , went to UC , normal UA .  Nausea and emesis x5 since last night .  Pain radiates to right groin and rl back

## 2023-04-30 NOTE — Discharge Instructions (Addendum)
Your CT scan and ultrasound did not show sign of appendicitis or ovarian problems.  Follow-up for recheck with your primary doctor.  Use Tylenol every 4 hours and ibuprofen every 6 as needed for pain or fevers. For persistent fevers and uncontrolled pain return to the ER. Use Zofran every 6 hours needed for vomiting. Please give urine sample before discharge so you can follow-up urine culture result with primary doctor.

## 2023-05-01 LAB — URINE CULTURE: Culture: <10000 — AB

## 2023-07-07 DIAGNOSIS — K529 Noninfective gastroenteritis and colitis, unspecified: Secondary | ICD-10-CM | POA: Diagnosis not present

## 2023-07-07 DIAGNOSIS — R112 Nausea with vomiting, unspecified: Secondary | ICD-10-CM | POA: Diagnosis not present

## 2023-07-07 DIAGNOSIS — M791 Myalgia, unspecified site: Secondary | ICD-10-CM | POA: Diagnosis not present

## 2023-07-07 DIAGNOSIS — R197 Diarrhea, unspecified: Secondary | ICD-10-CM | POA: Diagnosis not present

## 2024-01-17 ENCOUNTER — Emergency Department (HOSPITAL_COMMUNITY): Admission: EM | Admit: 2024-01-17 | Discharge: 2024-01-17 | Disposition: A | Discharge status: Home / Self Care

## 2024-01-17 ENCOUNTER — Encounter (HOSPITAL_COMMUNITY): Payer: Self-pay | Admitting: Emergency Medicine

## 2024-01-17 ENCOUNTER — Other Ambulatory Visit: Payer: Self-pay

## 2024-01-17 DIAGNOSIS — T43221A Poisoning by selective serotonin reuptake inhibitors, accidental (unintentional), initial encounter: Secondary | ICD-10-CM | POA: Insufficient documentation

## 2024-01-17 DIAGNOSIS — T50901A Poisoning by unspecified drugs, medicaments and biological substances, accidental (unintentional), initial encounter: Secondary | ICD-10-CM

## 2024-01-17 LAB — RAPID URINE DRUG SCREEN, HOSP PERFORMED
Amphetamines: NOT DETECTED
Barbiturates: NOT DETECTED
Benzodiazepines: NOT DETECTED
Cocaine: NOT DETECTED
Opiates: NOT DETECTED
Tetrahydrocannabinol: POSITIVE — AB

## 2024-01-17 LAB — CBC
HCT: 44 % (ref 36.0–46.0)
Hemoglobin: 14.9 g/dL (ref 12.0–15.0)
MCH: 31.9 pg (ref 26.0–34.0)
MCHC: 33.9 g/dL (ref 30.0–36.0)
MCV: 94.2 fL (ref 80.0–100.0)
Platelets: 347 K/uL (ref 150–400)
RBC: 4.67 MIL/uL (ref 3.87–5.11)
RDW: 11.9 % (ref 11.5–15.5)
WBC: 13.5 K/uL — ABNORMAL HIGH (ref 4.0–10.5)
nRBC: 0 % (ref 0.0–0.2)

## 2024-01-17 LAB — COMPREHENSIVE METABOLIC PANEL WITH GFR
ALT: 21 U/L (ref 0–44)
AST: 23 U/L (ref 15–41)
Albumin: 4.5 g/dL (ref 3.5–5.0)
Alkaline Phosphatase: 69 U/L (ref 38–126)
Anion gap: 12 (ref 5–15)
BUN: 10 mg/dL (ref 6–20)
CO2: 20 mmol/L — ABNORMAL LOW (ref 22–32)
Calcium: 9.4 mg/dL (ref 8.9–10.3)
Chloride: 106 mmol/L (ref 98–111)
Creatinine, Ser: 0.82 mg/dL (ref 0.44–1.00)
GFR, Estimated: >60 mL/min
Glucose, Bld: 100 mg/dL — ABNORMAL HIGH (ref 70–99)
Potassium: 3.5 mmol/L (ref 3.5–5.1)
Sodium: 138 mmol/L (ref 135–145)
Total Bilirubin: 0.4 mg/dL (ref 0.0–1.2)
Total Protein: 7.9 g/dL (ref 6.5–8.1)

## 2024-01-17 LAB — ETHANOL: Alcohol, Ethyl (B): <15 mg/dL

## 2024-01-17 LAB — HCG, SERUM, QUALITATIVE: Preg, Serum: NEGATIVE

## 2024-01-17 MED ORDER — HYDROXYZINE HCL 25 MG PO TABS
25.0000 mg | ORAL_TABLET | Freq: Four times a day (QID) | ORAL | 0 refills | Status: AC
Start: 2024-01-17 — End: ?

## 2024-01-17 MED ORDER — ONDANSETRON HCL 4 MG/2ML IJ SOLN
4.0000 mg | Freq: Once | INTRAMUSCULAR | Status: AC
  Administered 2024-01-17: 4 mg via INTRAVENOUS
  Filled 2024-01-17: qty 2

## 2024-01-17 MED ORDER — ONDANSETRON 4 MG PO TBDP
4.0000 mg | ORAL_TABLET | Freq: Three times a day (TID) | ORAL | 0 refills | Status: AC | PRN
Start: 2024-01-17 — End: ?

## 2024-01-17 MED ORDER — LORAZEPAM 1 MG PO TABS
1.0000 mg | ORAL_TABLET | Freq: Once | ORAL | Status: AC
  Administered 2024-01-17: 1 mg via ORAL
  Filled 2024-01-17: qty 1

## 2024-01-17 MED ORDER — LACTATED RINGERS IV BOLUS
1000.0000 mL | Freq: Once | INTRAVENOUS | Status: AC
  Administered 2024-01-17: 1000 mL via INTRAVENOUS

## 2024-01-17 NOTE — ED Provider Notes (Signed)
 [place] EMERGENCY DEPARTMENT AT [facility] Provider Note   CSN: [id number] Arrival date & time: 01/17/24  2026     Patient presents with: Drug Overdose   Mary Waller is a 28 y.o. female.   28 year old female with past medical history of anxiety presenting to the emergency department today after taking more than her prescribed sertraline.  The patient states that she forgot to take her dose this morning.  She normally takes 100 mg or 2 tablets of the 50 mg sertraline.  She states that she was feeling anxious throughout the day today so she thought that taking her dose for tonight and her morning dose early that this may help with her anxiety.  She states that shortly after she started to feel shaky and nauseated.  She states that she feels like she needs to go to the bathroom and have a bowel movement and feels like she may have some upset stomach but has not had any diarrhea yet.  She was feeling some palpitations as well.  She came to the ER at that time for further evaluation.  She denies any visual or auditory hallucinations and denies any suicidal or homicidal ideations.   Drug Overdose       Prior to Admission medications   Medication Sig Start Date End Date Taking? Authorizing Provider  hydrOXYzine  (ATARAX ) 25 MG tablet Take 1 tablet (25 mg total) by mouth every 6 (six) hours. 01/17/24  Yes Ula Prentice SAUNDERS, MD  ondansetron  (ZOFRAN -ODT) 4 MG disintegrating tablet Take 1 tablet (4 mg total) by mouth every 8 (eight) hours as needed for nausea or vomiting. 01/17/24  Yes Ula Prentice SAUNDERS, MD  benzonatate (TESSALON) 100 MG capsule Take 100 mg by mouth 3 (three) times daily as needed for cough. 04/16/23   [provider]  dexamethasone (DECADRON) 6 MG tablet Take 6 mg by mouth every morning. 04/16/23   [provider]  DULoxetine  (CYMBALTA ) 30 MG capsule Take 1 capsule (30 mg total) by mouth daily. Patient not taking: Reported on 06/13/2019 08/27/17 11/25/17   Ashok Kathrine HERO, PA-C  etonogestrel (NEXPLANON) 68 MG IMPL implant 1 each by Subdermal route once.    [provider]  ibuprofen  (ADVIL ) 600 MG tablet Take 1 tablet (600 mg total) by mouth every 6 (six) hours as needed. Patient not taking: Reported on 06/13/2019 12/28/18   Alona Knee, PA-C    Allergies: Patient has no known allergies.    Review of Systems  Gastrointestinal:  Positive for nausea.  Psychiatric/Behavioral:  The patient is nervous/anxious.   All other systems reviewed and are negative.   Updated Vital Signs BP 122/71   Pulse 78   Temp 98 F (36.7 C) (Oral)   Resp 16   SpO2 96%   Physical Exam Vitals and nursing note reviewed.   Gen: NAD Eyes: PERRL, EOMI HEENT: no oropharyngeal swelling Neck: trachea midline Resp: clear to auscultation bilaterally Card: Tachycardic, no murmurs, rubs, or gallops Abd: nontender, nondistended Extremities: no calf tenderness, no edema Vascular: 2+ radial pulses bilaterally, 2+ DP pulses bilaterally Neuro: No focal deficits, no rigidity noted Skin: no rashes Psyc: acting appropriately   (all labs ordered are listed, but only abnormal results are displayed) Labs Reviewed  COMPREHENSIVE METABOLIC PANEL WITH GFR - Abnormal; Notable for the following components:      Result Value   CO2 20 (*)    Glucose, Bld 100 (*)    All other components within normal limits  CBC -  Abnormal; Notable for the following components:   WBC 13.5 (*)    All other components within normal limits  RAPID URINE DRUG SCREEN, HOSP PERFORMED - Abnormal; Notable for the following components:   Tetrahydrocannabinol POSITIVE (*)    All other components within normal limits  ETHANOL  HCG, SERUM, QUALITATIVE    EKG: EKG Interpretation Date/Time:  Saturday January 17 2024 21:25:07 EDT Ventricular Rate:  78 PR Interval:  150 QRS Duration:  93 QT Interval:  361 QTC Calculation: 412 R Axis:   102  Text Interpretation: Sinus rhythm Borderline  right axis deviation Confirmed by Ula Barter [phone]) on 01/17/2024 9:29:48 PM  Radiology: No results found.   Procedures   Medications Ordered in the ED  lactated ringers  bolus 1,000 mL (1,000 mLs Intravenous New Bag/Given 01/17/24 2310)  LORazepam  (ATIVAN ) tablet 1 mg (1 mg Oral Given 01/17/24 2159)  ondansetron  (ZOFRAN ) injection 4 mg (4 mg Intravenous Given 01/17/24 2308)                                    Medical Decision Making 28 year old female with past medical history of anxiety presenting to the emergency department today with some nausea and feeling shaky after taking more than her prescribed sertraline for anxiety.  The patient is denying any suicidal or homicidal ideations at this time.  She states that she realizes that this was not a good idea on evaluation here.  Do not think that she requires IVC at this time.  I will give the patient IV fluids and obtain basic labs as well as a pregnancy test given the nausea.  Will give her Zofran  as well as Ativan  for her symptoms.  With this being the high dose of normal for the medication do not think that she should require long-term monitoring if we are able to help with her symptoms.  I will reevaluate for ultimate disposition.  The patient's labs are reassuring.  Vital signs are back to normal on reassessment.  The patient is feeling better after medications.  She is tolerating p.o.  Think she is stable for discharge.  She is counseled on proper use of her medication and is given hydroxyzine  to take as needed for situational anxiety.  Amount and/or Complexity of Data Reviewed Labs: ordered.  Risk Prescription drug management.        Final diagnoses:  Accidental drug overdose, initial encounter    ED Discharge Orders          Ordered    hydrOXYzine  (ATARAX ) 25 MG tablet  Every 6 hours        01/17/24 2334    ondansetron  (ZOFRAN -ODT) 4 MG disintegrating tablet  Every 8 hours PRN        01/17/24 2334                Ula Barter SAUNDERS, MD 01/17/24 2335

## 2024-01-17 NOTE — ED Triage Notes (Signed)
 Pt reports she has been having increasing anxiety recently so earlier she took 3.5 of her zoloft in an attempt to quell that.  Pt reports this was not an attempt to harm herself, just to relieve anxiety.  Pt reports nausea, somnolence, and feeling that she may have diarrhea.

## 2024-01-17 NOTE — Discharge Instructions (Signed)
 Please do not take more than your prescribed dosage of medications in the future.  Take the hydroxyzine  as needed for situational anxiety.  You may start back on your regular dose of the sertraline but you may want to wait until tomorrow evening to take the dose tomorrow.  Take Zofran  as needed for nausea.  Return to the ER for worsening symptoms.
